# Patient Record
Sex: Female | Born: 2000 | Race: White | Hispanic: No | Marital: Single | Smoking: Never smoker
Health system: Southern US, Community
[De-identification: ages and names within clinical notes are randomized; demographics above are authoritative.]

## PROBLEM LIST (undated history)

## (undated) DIAGNOSIS — F419 Anxiety disorder, unspecified: Secondary | ICD-10-CM

## (undated) DIAGNOSIS — D649 Anemia, unspecified: Secondary | ICD-10-CM

## (undated) DIAGNOSIS — F988 Other specified behavioral and emotional disorders with onset usually occurring in childhood and adolescence: Secondary | ICD-10-CM

## (undated) DIAGNOSIS — A749 Chlamydial infection, unspecified: Secondary | ICD-10-CM

## (undated) DIAGNOSIS — F32A Depression, unspecified: Secondary | ICD-10-CM

## (undated) DIAGNOSIS — N390 Urinary tract infection, site not specified: Secondary | ICD-10-CM

## (undated) DIAGNOSIS — F429 Obsessive-compulsive disorder, unspecified: Secondary | ICD-10-CM

## (undated) DIAGNOSIS — F909 Attention-deficit hyperactivity disorder, unspecified type: Secondary | ICD-10-CM

## (undated) HISTORY — PX: MOUTH SURGERY: SHX715

---
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
## 2022-09-20 ENCOUNTER — Encounter (HOSPITAL_COMMUNITY): Payer: Self-pay | Admitting: Obstetrics and Gynecology

## 2022-09-20 ENCOUNTER — Inpatient Hospital Stay (HOSPITAL_COMMUNITY): Payer: BLUE CROSS/BLUE SHIELD

## 2022-09-20 ENCOUNTER — Inpatient Hospital Stay (HOSPITAL_COMMUNITY)
Admission: AD | Admit: 2022-09-20 | Discharge: 2022-09-20 | Disposition: A | Discharge status: Home / Self Care | Payer: BLUE CROSS/BLUE SHIELD | Attending: Obstetrics and Gynecology | Admitting: Obstetrics and Gynecology

## 2022-09-20 DIAGNOSIS — O26899 Other specified pregnancy related conditions, unspecified trimester: Secondary | ICD-10-CM

## 2022-09-20 DIAGNOSIS — Z3A01 Less than 8 weeks gestation of pregnancy: Secondary | ICD-10-CM

## 2022-09-20 DIAGNOSIS — R109 Unspecified abdominal pain: Secondary | ICD-10-CM | POA: Diagnosis present

## 2022-09-20 DIAGNOSIS — Z6791 Unspecified blood type, Rh negative: Secondary | ICD-10-CM

## 2022-09-20 DIAGNOSIS — O26891 Other specified pregnancy related conditions, first trimester: Secondary | ICD-10-CM | POA: Diagnosis not present

## 2022-09-20 HISTORY — DX: Depression, unspecified: F32.A

## 2022-09-20 HISTORY — DX: Urinary tract infection, site not specified: N39.0

## 2022-09-20 HISTORY — DX: Anemia, unspecified: D64.9

## 2022-09-20 HISTORY — DX: Anxiety disorder, unspecified: F41.9

## 2022-09-20 HISTORY — DX: Chlamydial infection, unspecified: A74.9

## 2022-09-20 LAB — COMPREHENSIVE METABOLIC PANEL
ALT: 14 U/L (ref 0–44)
AST: 16 U/L (ref 15–41)
Albumin: 4 g/dL (ref 3.5–5.0)
Alkaline Phosphatase: 45 U/L (ref 38–126)
Anion gap: 11 (ref 5–15)
BUN: 6 mg/dL (ref 6–20)
CO2: 24 mmol/L (ref 22–32)
Calcium: 9.5 mg/dL (ref 8.9–10.3)
Chloride: 103 mmol/L (ref 98–111)
Creatinine, Ser: 0.86 mg/dL (ref 0.44–1.00)
GFR, Estimated: >60 mL/min (ref >60)
Glucose, Bld: 88 mg/dL (ref 70–99)
Potassium: 4.3 mmol/L (ref 3.5–5.1)
Sodium: 138 mmol/L (ref 135–145)
Total Bilirubin: 0.4 mg/dL (ref 0.3–1.2)
Total Protein: 6.9 g/dL (ref 6.5–8.1)

## 2022-09-20 LAB — CBC WITH DIFFERENTIAL/PLATELET
Abs Immature Granulocytes: 0.02 10*3/uL (ref 0.00–0.07)
Basophils Absolute: 0 10*3/uL (ref 0.0–0.1)
Basophils Relative: 1 %
Eosinophils Absolute: 0.1 10*3/uL (ref 0.0–0.5)
Eosinophils Relative: 2 %
HCT: 34.6 % — ABNORMAL LOW (ref 36.0–46.0)
Hemoglobin: 11.8 g/dL — ABNORMAL LOW (ref 12.0–15.0)
Immature Granulocytes: 0 %
Lymphocytes Relative: 33 %
Lymphs Abs: 2 10*3/uL (ref 0.7–4.0)
MCH: 29.6 pg (ref 26.0–34.0)
MCHC: 34.1 g/dL (ref 30.0–36.0)
MCV: 86.9 fL (ref 80.0–100.0)
Monocytes Absolute: 0.5 10*3/uL (ref 0.1–1.0)
Monocytes Relative: 9 %
Neutro Abs: 3.2 10*3/uL (ref 1.7–7.7)
Neutrophils Relative %: 55 %
Platelets: 257 10*3/uL (ref 150–400)
RBC: 3.98 MIL/uL (ref 3.87–5.11)
RDW: 12.6 % (ref 11.5–15.5)
WBC: 5.9 10*3/uL (ref 4.0–10.5)
nRBC: 0 % (ref 0.0–0.2)

## 2022-09-20 LAB — WET PREP, GENITAL
Clue Cells Wet Prep HPF POC: NONE SEEN
Sperm: NONE SEEN
Trich, Wet Prep: NONE SEEN
WBC, Wet Prep HPF POC: <10 (ref <10)
Yeast Wet Prep HPF POC: NONE SEEN

## 2022-09-20 LAB — ABO/RH
ABO/RH(D): A NEG
Antibody Screen: NEGATIVE

## 2022-09-20 LAB — HCG, QUANTITATIVE, PREGNANCY: hCG, Beta Chain, Quant, S: 16571 m[IU]/mL — ABNORMAL HIGH (ref <5)

## 2022-09-20 NOTE — MAU Provider Note (Signed)
History     CSN: [id number]  Arrival date and time: 09/20/22 1530   None     Chief Complaint  Patient presents with   Abdominal Pain   HPI  Ms.Kathy Bauer is a 22 y.o. female G1P0 @ [redacted]w[redacted]d here in MAU for abdominal pain. She was seen at urgent care tonight and sent here for further evaluation. She wants to make sure everything is ok. She has no pain or bleeding at this time.   Patient is present today with her mom.   OB History     Gravida  1   Para      Term      Preterm      AB      Living         SAB      IAB      Ectopic      Multiple      Live Births              Past Medical History:  Diagnosis Date   ADHD    Anemia    Anxiety    Chlamydia    Depression    OCD (obsessive compulsive disorder)    UTI (urinary tract infection)     Past Surgical History:  Procedure Laterality Date   MOUTH SURGERY      Family History  Problem Relation Age of Onset   Fibromyalgia Mother    Healthy Father     Social History   Tobacco Use   Smoking status: Never   Smokeless tobacco: Never  Vaping Use   Vaping status: Former  Substance Use Topics   Alcohol use: Not Currently   Drug use: Not Currently    Types: Marijuana    Allergies:  Allergies  Allergen Reactions   Amoxicillin Hives    Medications Prior to Admission  Medication Sig Dispense Refill Last Dose   Prenatal Vit-Fe Fumarate-FA (MULTIVITAMIN-PRENATAL) 27-0.8 MG TABS tablet Take 1 tablet by mouth daily at 12 noon.   09/20/2022   amphetamine-dextroamphetamine (ADDERALL) 15 MG tablet Take 1 tablet by mouth daily after breakfast. 30 tablet 0    amphetamine-dextroamphetamine (ADDERALL) 15 MG tablet Take 1 tablet by mouth daily after breakfast. 30 tablet 0    amphetamine-dextroamphetamine (ADDERALL) 15 MG tablet Take 1 tablet by mouth daily after breakfast. 30 tablet 0    sertraline (ZOLOFT) 50 MG tablet TAKE 1 TABLET BY MOUTH EVERYDAY AT BEDTIME 90 tablet 1     Review of Systems   Genitourinary:  Negative for dysuria, frequency and urgency.   Physical Exam   Blood pressure (!) 109/56, pulse 74, temperature 98.4 F (36.9 C), temperature source Oral, resp. rate 17, height 5\' 5"  (1.651 m), weight 58.1 kg, last menstrual period 08/01/2022, SpO2 100%.  Physical Exam Constitutional:      General: She is not in acute distress.    Appearance: She is well-developed. She is not ill-appearing, toxic-appearing or diaphoretic.  Eyes:     Pupils: Pupils are equal, round, and reactive to light.  Neurological:     Mental Status: She is alert and oriented to person, place, and time.    MAU Course  Procedures  MDM  Wet prep & GC HIV, CBC, Hcg, ABO US OB transvaginal    Assessment and Plan   A:  1. Abdominal pain affecting pregnancy   2. [redacted] weeks gestation of pregnancy   3. Rh D negative blood type      P:  Dc home She has an appointment with [facility] Sept 5th.  She should keep that appointment Rhogam not given- no bleeding today Return to MAU if symptoms worsen Pelvic rest PV daily + folic acid 1 mg daily until 14 weeks.   Duane Lope, NP 09/20/2022 7:24 PM

## 2022-09-20 NOTE — MAU Note (Addendum)
Kathy Bauer is a 22 y.o. at [redacted]w[redacted]d here in MAU reporting: preg confirmed earlier today at urgent care.  Has been having some cramping- feels like period is going to start.  Went to UC, they confirmed the preg, but couldn't see anything on [place]. No bleeding. LMP: 7/5, regular cycles Onset of complaint: wk ago Pain score: moderate Vitals:   09/20/22 1607  BP: (!) 109/56  Pulse: 74  Resp: 17  Temp: 98.4 F (36.9 C)  SpO2: 100%      Lab orders placed from triage:  vag cultures  UA UPT done at [facility], able to see results.

## 2022-09-22 LAB — GC/CHLAMYDIA PROBE AMP (~~LOC~~) NOT AT ARMC
Chlamydia: NEGATIVE
Comment: NEGATIVE
Comment: NORMAL
Neisseria Gonorrhea: NEGATIVE

## 2022-10-02 DIAGNOSIS — N911 Secondary amenorrhea: Secondary | ICD-10-CM | POA: Diagnosis not present

## 2022-10-09 DIAGNOSIS — Z79899 Other long term (current) drug therapy: Secondary | ICD-10-CM | POA: Diagnosis not present

## 2022-10-09 DIAGNOSIS — Z880 Allergy status to penicillin: Secondary | ICD-10-CM | POA: Diagnosis not present

## 2022-10-09 DIAGNOSIS — O26891 Other specified pregnancy related conditions, first trimester: Secondary | ICD-10-CM | POA: Diagnosis not present

## 2022-10-09 DIAGNOSIS — O368911 Maternal care for other specified fetal problems, first trimester, fetus 1: Secondary | ICD-10-CM | POA: Diagnosis not present

## 2022-10-09 DIAGNOSIS — Z3A08 8 weeks gestation of pregnancy: Secondary | ICD-10-CM | POA: Diagnosis not present

## 2022-10-09 DIAGNOSIS — O2341 Unspecified infection of urinary tract in pregnancy, first trimester: Secondary | ICD-10-CM | POA: Diagnosis not present

## 2022-10-09 DIAGNOSIS — R1030 Lower abdominal pain, unspecified: Secondary | ICD-10-CM | POA: Diagnosis not present

## 2022-10-09 DIAGNOSIS — O208 Other hemorrhage in early pregnancy: Secondary | ICD-10-CM | POA: Diagnosis not present

## 2022-10-09 DIAGNOSIS — N390 Urinary tract infection, site not specified: Secondary | ICD-10-CM | POA: Diagnosis not present

## 2022-10-09 DIAGNOSIS — R197 Diarrhea, unspecified: Secondary | ICD-10-CM | POA: Diagnosis not present

## 2022-10-10 DIAGNOSIS — Z3685 Encounter for antenatal screening for Streptococcus B: Secondary | ICD-10-CM | POA: Diagnosis not present

## 2022-10-10 DIAGNOSIS — Z3A08 8 weeks gestation of pregnancy: Secondary | ICD-10-CM | POA: Diagnosis not present

## 2022-10-10 DIAGNOSIS — Z3401 Encounter for supervision of normal first pregnancy, first trimester: Secondary | ICD-10-CM | POA: Diagnosis not present

## 2022-10-10 DIAGNOSIS — Z3481 Encounter for supervision of other normal pregnancy, first trimester: Secondary | ICD-10-CM | POA: Diagnosis not present

## 2022-10-10 LAB — OB RESULTS CONSOLE RPR: RPR: NONREACTIVE

## 2022-10-10 LAB — OB RESULTS CONSOLE HEPATITIS B SURFACE ANTIGEN: Hepatitis B Surface Ag: NEGATIVE

## 2022-10-10 LAB — OB RESULTS CONSOLE HIV ANTIBODY (ROUTINE TESTING): HIV: NONREACTIVE

## 2022-10-10 LAB — HEPATITIS C ANTIBODY: HCV Ab: NEGATIVE

## 2022-10-10 LAB — OB RESULTS CONSOLE RUBELLA ANTIBODY, IGM: Rubella: IMMUNE

## 2022-10-21 ENCOUNTER — Inpatient Hospital Stay (HOSPITAL_COMMUNITY): Payer: 59

## 2022-10-21 ENCOUNTER — Inpatient Hospital Stay (HOSPITAL_COMMUNITY)
Admission: AD | Admit: 2022-10-21 | Discharge: 2022-10-21 | Disposition: A | Discharge status: Home / Self Care | Payer: 59 | Attending: Obstetrics and Gynecology | Admitting: Obstetrics and Gynecology

## 2022-10-21 ENCOUNTER — Encounter (HOSPITAL_COMMUNITY): Payer: Self-pay | Admitting: Obstetrics and Gynecology

## 2022-10-21 DIAGNOSIS — O99351 Diseases of the nervous system complicating pregnancy, first trimester: Secondary | ICD-10-CM | POA: Diagnosis not present

## 2022-10-21 DIAGNOSIS — J324 Chronic pansinusitis: Secondary | ICD-10-CM | POA: Insufficient documentation

## 2022-10-21 DIAGNOSIS — R519 Headache, unspecified: Secondary | ICD-10-CM | POA: Diagnosis not present

## 2022-10-21 DIAGNOSIS — Z113 Encounter for screening for infections with a predominantly sexual mode of transmission: Secondary | ICD-10-CM | POA: Diagnosis not present

## 2022-10-21 DIAGNOSIS — O219 Vomiting of pregnancy, unspecified: Secondary | ICD-10-CM | POA: Diagnosis not present

## 2022-10-21 DIAGNOSIS — G43909 Migraine, unspecified, not intractable, without status migrainosus: Secondary | ICD-10-CM

## 2022-10-21 DIAGNOSIS — O99511 Diseases of the respiratory system complicating pregnancy, first trimester: Secondary | ICD-10-CM | POA: Diagnosis not present

## 2022-10-21 DIAGNOSIS — G43009 Migraine without aura, not intractable, without status migrainosus: Secondary | ICD-10-CM | POA: Diagnosis not present

## 2022-10-21 DIAGNOSIS — Z3400 Encounter for supervision of normal first pregnancy, unspecified trimester: Secondary | ICD-10-CM | POA: Diagnosis not present

## 2022-10-21 DIAGNOSIS — Z3481 Encounter for supervision of other normal pregnancy, first trimester: Secondary | ICD-10-CM | POA: Diagnosis not present

## 2022-10-21 DIAGNOSIS — Z3A11 11 weeks gestation of pregnancy: Secondary | ICD-10-CM | POA: Diagnosis not present

## 2022-10-21 DIAGNOSIS — Z3A09 9 weeks gestation of pregnancy: Secondary | ICD-10-CM | POA: Diagnosis not present

## 2022-10-21 LAB — CBC WITH DIFFERENTIAL/PLATELET
Abs Immature Granulocytes: 0.08 10*3/uL — ABNORMAL HIGH (ref 0.00–0.07)
Basophils Absolute: 0 10*3/uL (ref 0.0–0.1)
Basophils Relative: 0 %
Eosinophils Absolute: 0.1 10*3/uL (ref 0.0–0.5)
Eosinophils Relative: 1 %
HCT: 29 % — ABNORMAL LOW (ref 36.0–46.0)
Hemoglobin: 10.2 g/dL — ABNORMAL LOW (ref 12.0–15.0)
Immature Granulocytes: 1 %
Lymphocytes Relative: 12 %
Lymphs Abs: 1.6 10*3/uL (ref 0.7–4.0)
MCH: 30 pg (ref 26.0–34.0)
MCHC: 35.2 g/dL (ref 30.0–36.0)
MCV: 85.3 fL (ref 80.0–100.0)
Monocytes Absolute: 0.7 10*3/uL (ref 0.1–1.0)
Monocytes Relative: 5 %
Neutro Abs: 10.9 10*3/uL — ABNORMAL HIGH (ref 1.7–7.7)
Neutrophils Relative %: 81 %
Platelets: 214 10*3/uL (ref 150–400)
RBC: 3.4 MIL/uL — ABNORMAL LOW (ref 3.87–5.11)
RDW: 12 % (ref 11.5–15.5)
WBC: 13.4 10*3/uL — ABNORMAL HIGH (ref 4.0–10.5)
nRBC: 0 % (ref 0.0–0.2)

## 2022-10-21 LAB — COMPREHENSIVE METABOLIC PANEL
ALT: 11 U/L (ref 0–44)
AST: 14 U/L — ABNORMAL LOW (ref 15–41)
Albumin: 3.3 g/dL — ABNORMAL LOW (ref 3.5–5.0)
Alkaline Phosphatase: 42 U/L (ref 38–126)
Anion gap: 9 (ref 5–15)
BUN: 5 mg/dL — ABNORMAL LOW (ref 6–20)
CO2: 22 mmol/L (ref 22–32)
Calcium: 9 mg/dL (ref 8.9–10.3)
Chloride: 105 mmol/L (ref 98–111)
Creatinine, Ser: 0.55 mg/dL (ref 0.44–1.00)
GFR, Estimated: >60 mL/min (ref >60)
Glucose, Bld: 85 mg/dL (ref 70–99)
Potassium: 3.8 mmol/L (ref 3.5–5.1)
Sodium: 136 mmol/L (ref 135–145)
Total Bilirubin: 0.4 mg/dL (ref 0.3–1.2)
Total Protein: 6.3 g/dL — ABNORMAL LOW (ref 6.5–8.1)

## 2022-10-21 LAB — URINALYSIS, ROUTINE W REFLEX MICROSCOPIC
Bilirubin Urine: NEGATIVE
Glucose, UA: NEGATIVE mg/dL
Hgb urine dipstick: NEGATIVE
Ketones, ur: NEGATIVE mg/dL
Leukocytes,Ua: NEGATIVE
Nitrite: NEGATIVE
Protein, ur: NEGATIVE mg/dL
Specific Gravity, Urine: 1.013 (ref 1.005–1.030)
pH: 7 (ref 5.0–8.0)

## 2022-10-21 MED ORDER — DIPHENHYDRAMINE HCL 50 MG/ML IJ SOLN
12.5000 mg | Freq: Once | INTRAMUSCULAR | Status: AC
  Administered 2022-10-21: 12.5 mg via INTRAVENOUS
  Filled 2022-10-21: qty 1

## 2022-10-21 MED ORDER — MAGNESIUM SULFATE 2 GM/50ML IV SOLN
2.0000 g | Freq: Once | INTRAVENOUS | Status: DC
  Filled 2022-10-21: qty 50

## 2022-10-21 MED ORDER — OXYCODONE-ACETAMINOPHEN 5-325 MG PO TABS: 1.0000 | ORAL_TABLET | ORAL | 0 refills | Status: DC | PRN

## 2022-10-21 MED ORDER — LACTATED RINGERS IV BOLUS: 1000.0000 mL | Freq: Once | INTRAVENOUS | Status: DC

## 2022-10-21 MED ORDER — LACTATED RINGERS IV BOLUS
1000.0000 mL | Freq: Once | INTRAVENOUS | Status: AC
  Administered 2022-10-21: 1000 mL via INTRAVENOUS

## 2022-10-21 MED ORDER — PROCHLORPERAZINE EDISYLATE 10 MG/2ML IJ SOLN
10.0000 mg | Freq: Once | INTRAMUSCULAR | Status: AC
  Administered 2022-10-21: 10 mg via INTRAVENOUS
  Filled 2022-10-21: qty 2

## 2022-10-21 MED ORDER — KETOROLAC TROMETHAMINE 30 MG/ML IJ SOLN
30.0000 mg | Freq: Once | INTRAMUSCULAR | Status: AC
  Administered 2022-10-21: 30 mg via INTRAVENOUS
  Filled 2022-10-21: qty 1

## 2022-10-21 MED ORDER — MUSCLE RUB 10-15 % EX CREA
TOPICAL_CREAM | CUTANEOUS | Status: DC | PRN
  Filled 2022-10-21: qty 85

## 2022-10-21 MED ORDER — IOHEXOL 350 MG/ML SOLN
75.0000 mL | Freq: Once | INTRAVENOUS | Status: AC | PRN
  Administered 2022-10-21: 75 mL via INTRAVENOUS

## 2022-10-21 MED ORDER — METOCLOPRAMIDE HCL 10 MG PO TABS: 10.0000 mg | ORAL_TABLET | Freq: Three times a day (TID) | ORAL | 1 refills | Status: DC

## 2022-10-21 NOTE — MAU Note (Signed)
Kathy Bauer is a 22 y.o. at [redacted]w[redacted]d here in MAU reporting: has a really bad migraine, started on Saturday, gets worse everyday. Taking Tylenol, no relief.  Keeps throwing up now Onset of complaint: Saturday Pain score: 10 Vitals:   10/21/22 1415  BP: 112/71  Pulse: 96  Resp: 16  Temp: 98.4 F (36.9 C)  SpO2: 100%     FHT:166 Lab orders placed from triage:  urine

## 2022-10-21 NOTE — MAU Provider Note (Addendum)
History     CSN: [id number]  Arrival date and time: 10/21/22 1341   Event Date/Time   First Provider Initiated Contact with Patient 10/21/22 1458      Chief Complaint  Patient presents with   Headache   Emesis   HPI  Ms.Kathy Bauer Is a 22 y.o. female G1P0 @ [redacted]w[redacted]d here in MAU with complaints of new onset HA. She reports symptoms started on Saturday. She took tylenol 1 gram and that did not provide any relief.  The HA is located on the top of her head, she feels like someone is hitting her on the head. She has had no injuries recently. She reports photophobia and nausea/ vomiting. Reports the HA is debilitating.    No history of migraines or HA. Has had sinus issues recently.   OB History     Gravida  1   Para      Term      Preterm      AB      Living         SAB      IAB      Ectopic      Multiple      Live Births              Past Medical History:  Diagnosis Date   ADHD    Anemia    Anxiety    Chlamydia    Depression    OCD (obsessive compulsive disorder)    UTI (urinary tract infection)     Past Surgical History:  Procedure Laterality Date   MOUTH SURGERY      Family History  Problem Relation Age of Onset   Fibromyalgia Mother    Healthy Father     Social History   Tobacco Use   Smoking status: Never   Smokeless tobacco: Never  Vaping Use   Vaping status: Former  Substance Use Topics   Alcohol use: Not Currently   Drug use: Not Currently    Types: Marijuana    Allergies:  Allergies  Allergen Reactions   Amoxicillin Hives    Medications Prior to Admission  Medication Sig Dispense Refill Last Dose   acetaminophen (TYLENOL) 500 MG tablet Take 1,000 mg by mouth every 6 (six) hours as needed for headache or moderate pain.   10/20/2022   Prenatal Vit-Fe Fumarate-FA (MULTIVITAMIN-PRENATAL) 27-0.8 MG TABS tablet Take 1 tablet by mouth daily at 12 noon.      sertraline (ZOLOFT) 50 MG tablet TAKE 1 TABLET BY MOUTH EVERYDAY  AT BEDTIME (Patient not taking: Reported on 10/21/2022) 90 tablet 1 Not Taking   Results for orders placed or performed during the hospital encounter of 10/21/22 (from the past 48 hour(s))  Urinalysis, Routine w reflex microscopic -Urine, Clean Catch     Status: Abnormal   Collection Time: 10/21/22  4:24 PM  Result Value Ref Range   Color, Urine YELLOW YELLOW   APPearance HAZY (A) CLEAR   Specific Gravity, Urine 1.013 1.005 - 1.030   pH 7.0 5.0 - 8.0   Glucose, UA NEGATIVE NEGATIVE mg/dL   Hgb urine dipstick NEGATIVE NEGATIVE   Bilirubin Urine NEGATIVE NEGATIVE   Ketones, ur NEGATIVE NEGATIVE mg/dL   Protein, ur NEGATIVE NEGATIVE mg/dL   Nitrite NEGATIVE NEGATIVE   Leukocytes,Ua NEGATIVE NEGATIVE    Comment: Performed at [facility], [address] [place], [place] [address]  CBC with Differential/Platelet     Status: Abnormal   Collection Time:  10/21/22  5:02 PM  Result Value Ref Range   WBC 13.4 (H) 4.0 - 10.5 K/uL   RBC 3.40 (L) 3.87 - 5.11 MIL/uL   Hemoglobin 10.2 (L) 12.0 - 15.0 g/dL   HCT 52.8 (L) 41.3 - 24.4 %   MCV 85.3 80.0 - 100.0 fL   MCH 30.0 26.0 - 34.0 pg   MCHC 35.2 30.0 - 36.0 g/dL   RDW 01.0 27.2 - 53.6 %   Platelets 214 150 - 400 K/uL   nRBC 0.0 0.0 - 0.2 %   Neutrophils Relative % 81 %   Neutro Abs 10.9 (H) 1.7 - 7.7 K/uL   Lymphocytes Relative 12 %   Lymphs Abs 1.6 0.7 - 4.0 K/uL   Monocytes Relative 5 %   Monocytes Absolute 0.7 0.1 - 1.0 K/uL   Eosinophils Relative 1 %   Eosinophils Absolute 0.1 0.0 - 0.5 K/uL   Basophils Relative 0 %   Basophils Absolute 0.0 0.0 - 0.1 K/uL   Immature Granulocytes 1 %   Abs Immature Granulocytes 0.08 (H) 0.00 - 0.07 K/uL    Comment: Performed at [facility], [address], [place], [place] [address]  Comprehensive metabolic panel     Status: Abnormal   Collection Time: 10/21/22  5:04 PM  Result Value Ref Range   Sodium 136 135 - 145 mmol/L   Potassium 3.8 3.5 - 5.1 mmol/L   Chloride 105 98 - 111  mmol/L   CO2 22 22 - 32 mmol/L   Glucose, Bld 85 70 - 99 mg/dL    Comment: Glucose reference range applies only to samples taken after fasting for at least 8 hours.   BUN 5 (L) 6 - 20 mg/dL   Creatinine, Ser 4.74 0.44 - 1.00 mg/dL   Calcium 9.0 8.9 - 25.9 mg/dL   Total Protein 6.3 (L) 6.5 - 8.1 g/dL   Albumin 3.3 (L) 3.5 - 5.0 g/dL   AST 14 (L) 15 - 41 U/L   ALT 11 0 - 44 U/L   Alkaline Phosphatase 42 38 - 126 U/L   Total Bilirubin 0.4 0.3 - 1.2 mg/dL   GFR, Estimated >56 >38 mL/min    Comment: (NOTE) Calculated using the CKD-EPI Creatinine Equation (2021)    Anion gap 9 5 - 15    Comment: Performed at [facility], [address], [place], [place] [address]    MR BRAIN WO CONTRAST  Result Date: 10/21/2022 CLINICAL DATA:  Initial evaluation for acute headache. EXAM: MRI HEAD WITHOUT CONTRAST TECHNIQUE: Multiplanar, multiecho pulse sequences of the brain and surrounding structures were obtained without intravenous contrast. COMPARISON:  Prior CT from earlier the same day. FINDINGS: Brain: Cerebral volume within normal limits for age. No focal parenchymal signal abnormality. No abnormal foci of restricted diffusion to suggest acute or subacute ischemia. Gray-white matter differentiation well maintained. No encephalomalacia to suggest chronic cortical infarction or other insult. No foci of susceptibility artifact indicative of acute or chronic intracranial blood products. No mass lesion, midline shift or mass effect. Ventricles normal in size and morphology without hydrocephalus. No extra-axial fluid collection. Pituitary gland and suprasellar region within normal limits. Vascular: Major intracranial vascular flow voids are well maintained. Skull and upper cervical spine: Craniocervical junction within normal limits. Visualized upper cervical spine demonstrates no significant finding. Bone marrow signal intensity within normal limits. No scalp soft tissue abnormality. Sinuses/Orbits: Globes  and orbital soft tissues are within normal limits. Extensive pan sinusitis again noted. Mastoid  air cells are largely clear. Other: None. IMPRESSION: 1. Normal brain MRI. No acute intracranial abnormality. 2. Extensive pan sinusitis. Electronically Signed   By: Rise Mu M.D.   On: 10/21/2022 22:18   CT VENOGRAM HEAD  Result Date: 10/21/2022 CLINICAL DATA:  Initial evaluation for acute headache. EXAM: CT VENOGRAM HEAD TECHNIQUE: Venographic phase images of the brain were obtained following the administration of intravenous contrast. Multiplanar reformats and maximum intensity projections were generated. RADIATION DOSE REDUCTION: This exam was performed according to the departmental dose-optimization program which includes automated exposure control, adjustment of the mA and/or kV according to patient size and/or use of iterative reconstruction technique. CONTRAST:  75mL OMNIPAQUE IOHEXOL 350 MG/ML SOLN COMPARISON:  None Available. FINDINGS: Brain: Cerebral volume within normal limits for patient age. No evidence for acute intracranial hemorrhage. No findings to suggest acute large vessel territory infarct. No mass lesion, midline shift, or mass effect. Ventricles are normal in size without evidence for hydrocephalus. No extra-axial fluid collection identified. Vascular: No hyperdense vessel seen prior to 5/administration. Following contrast administration, normal enhancement seen throughout the superior sagittal sinus to the torcula. Transverse and sigmoid sinuses are patent as are the jugular bulbs and visualized proximal internal jugular veins. Straight sinus, vein of Galen, and internal cerebral veins are patent. No appreciable cortical vein abnormality. No appreciable abnormality about the cavernous sinus. Skull: Scalp soft tissues demonstrate no acute abnormality. Calvarium intact. Sinuses/Orbits: Globes and orbital soft tissues within normal limits. Extensive pan sinusitis with near complete  opacification of the visualized paranasal sinuses. Mastoid air cells and middle ear cavities are clear. IMPRESSION: 1. Normal CT venogram. No evidence for dural sinus thrombosis. 2. No other acute intracranial abnormality. 3. Extensive pan sinusitis, which could contribute to headache. Electronically Signed   By: Rise Mu M.D.   On: 10/21/2022 19:45     Review of Systems  Constitutional:  Negative for fever.  Eyes:  Negative for photophobia and visual disturbance.  Gastrointestinal:  Positive for nausea and vomiting. Negative for diarrhea.  Neurological:  Positive for headaches.   Physical Exam   Blood pressure 112/71, pulse 96, temperature 98.4 F (36.9 C), temperature source Oral, resp. rate 16, height 5\' 5"  (1.651 m), weight 56.7 kg, last menstrual period 08/01/2022, SpO2 100%.  Physical Exam Constitutional:      General: She is not in acute distress.    Appearance: She is well-developed. She is ill-appearing. She is not toxic-appearing or diaphoretic.  Eyes:     Extraocular Movements: Extraocular movements intact.     Pupils: Pupils are equal, round, and reactive to light.  Pulmonary:     Effort: Pulmonary effort is normal.  Neurological:     General: No focal deficit present.     Mental Status: She is alert and oriented to person, place, and time.     GCS: GCS eye subscore is 4. GCS verbal subscore is 5. GCS motor subscore is 6.     Sensory: Sensation is intact.     Motor: Motor function is intact.  Psychiatric:        Behavior: Behavior normal.     MAU Course  Procedures None  MDM  LR bolus with Compazine and benadryl. Initially patient reports she felt better reporting 5/10 HA, after getting up her HA pain is back to 8/10 Toradol 30 mg IV and another bolus of fluid ordered CBC and CMP pending Awaiting Head CT. Pain now reported as 0/10 Reviewed HPI, labs and CT results with on-call Neurologist  Dr. Derry Lory who recommends IV magnesium bolus, and MRI of  brain. If normal, she can go home  Magnesium held due to 0/10 pain and patient ready to go home.  Report given to Kathy Bauer CNM who resumes care of the patient.   Kathy Bauer, CNM - MRI normal, plan for discharge.  Assessment and Plan   1. Acute migraine   2. [redacted] weeks gestation of pregnancy   3. Nausea and vomiting during pregnancy    - Reviewed worsening signs and return precautions.  - Recommendation for patient to see ENT per Neuro request.  - Patient agreeable to plan of care.  - Rx sent to outpatient pharmacy for pick upon.  - patient discharged home in stable condition and may return to MAU as needed   Kathy Bauer) Kathy Portela, MSN, CNM  [facility] [employer] [facility]  10/21/2022 11:01 PM

## 2022-11-10 ENCOUNTER — Telehealth: Payer: Self-pay

## 2022-11-14 ENCOUNTER — Telehealth: Payer: Self-pay

## 2022-11-14 NOTE — Telephone Encounter (Signed)
Left message for patient to call office to schedule GC for [facility]

## 2022-12-24 DIAGNOSIS — Z3402 Encounter for supervision of normal first pregnancy, second trimester: Secondary | ICD-10-CM | POA: Diagnosis not present

## 2022-12-24 DIAGNOSIS — Z361 Encounter for antenatal screening for raised alphafetoprotein level: Secondary | ICD-10-CM | POA: Diagnosis not present

## 2022-12-24 DIAGNOSIS — Z363 Encounter for antenatal screening for malformations: Secondary | ICD-10-CM | POA: Diagnosis not present

## 2022-12-24 DIAGNOSIS — Z3A18 18 weeks gestation of pregnancy: Secondary | ICD-10-CM | POA: Diagnosis not present

## 2023-01-26 DIAGNOSIS — Z3A23 23 weeks gestation of pregnancy: Secondary | ICD-10-CM | POA: Diagnosis not present

## 2023-01-26 DIAGNOSIS — Z362 Encounter for other antenatal screening follow-up: Secondary | ICD-10-CM | POA: Diagnosis not present

## 2023-01-28 NOTE — L&D Delivery Note (Signed)
 Delivery Note  SVD viable female Apgars over 8,9 over 2nd degree MLE.  Placenta delivered spontaneously intact with 3VC. Repair with 2-0 with good support and hemostasis noted.  R/V exam confirms. .   Mother and baby to couplet care and are doing well.  EBL 99 cc  Belle Box, MD

## 2023-02-25 DIAGNOSIS — Z23 Encounter for immunization: Secondary | ICD-10-CM | POA: Diagnosis not present

## 2023-02-25 DIAGNOSIS — Z3402 Encounter for supervision of normal first pregnancy, second trimester: Secondary | ICD-10-CM | POA: Diagnosis not present

## 2023-02-25 DIAGNOSIS — Z3481 Encounter for supervision of other normal pregnancy, first trimester: Secondary | ICD-10-CM | POA: Diagnosis not present

## 2023-02-25 DIAGNOSIS — Z3A27 27 weeks gestation of pregnancy: Secondary | ICD-10-CM | POA: Diagnosis not present

## 2023-03-12 DIAGNOSIS — Z3403 Encounter for supervision of normal first pregnancy, third trimester: Secondary | ICD-10-CM | POA: Diagnosis not present

## 2023-03-12 DIAGNOSIS — G43909 Migraine, unspecified, not intractable, without status migrainosus: Secondary | ICD-10-CM | POA: Diagnosis not present

## 2023-03-12 DIAGNOSIS — Z3A30 30 weeks gestation of pregnancy: Secondary | ICD-10-CM | POA: Diagnosis not present

## 2023-03-12 DIAGNOSIS — O360130 Maternal care for anti-D [Rh] antibodies, third trimester, not applicable or unspecified: Secondary | ICD-10-CM | POA: Diagnosis not present

## 2023-04-22 DIAGNOSIS — Z3A35 35 weeks gestation of pregnancy: Secondary | ICD-10-CM | POA: Diagnosis not present

## 2023-04-22 DIAGNOSIS — O26899 Other specified pregnancy related conditions, unspecified trimester: Secondary | ICD-10-CM | POA: Diagnosis not present

## 2023-04-22 DIAGNOSIS — R030 Elevated blood-pressure reading, without diagnosis of hypertension: Secondary | ICD-10-CM | POA: Diagnosis not present

## 2023-04-22 DIAGNOSIS — R3911 Hesitancy of micturition: Secondary | ICD-10-CM | POA: Diagnosis not present

## 2023-04-22 DIAGNOSIS — Z3403 Encounter for supervision of normal first pregnancy, third trimester: Secondary | ICD-10-CM | POA: Diagnosis not present

## 2023-04-23 ENCOUNTER — Inpatient Hospital Stay (HOSPITAL_COMMUNITY)
Admission: AD | Admit: 2023-04-23 | Discharge: 2023-04-23 | Disposition: A | Discharge status: Home / Self Care | Attending: Obstetrics and Gynecology | Admitting: Obstetrics and Gynecology

## 2023-04-23 ENCOUNTER — Encounter (HOSPITAL_COMMUNITY): Payer: Self-pay | Admitting: Obstetrics and Gynecology

## 2023-04-23 DIAGNOSIS — O479 False labor, unspecified: Secondary | ICD-10-CM | POA: Diagnosis not present

## 2023-04-23 DIAGNOSIS — Z3A37 37 weeks gestation of pregnancy: Secondary | ICD-10-CM | POA: Diagnosis not present

## 2023-04-23 DIAGNOSIS — O4703 False labor before 37 completed weeks of gestation, third trimester: Secondary | ICD-10-CM | POA: Insufficient documentation

## 2023-04-23 DIAGNOSIS — R109 Unspecified abdominal pain: Secondary | ICD-10-CM | POA: Insufficient documentation

## 2023-04-23 DIAGNOSIS — R030 Elevated blood-pressure reading, without diagnosis of hypertension: Secondary | ICD-10-CM | POA: Insufficient documentation

## 2023-04-23 DIAGNOSIS — O26893 Other specified pregnancy related conditions, third trimester: Secondary | ICD-10-CM | POA: Insufficient documentation

## 2023-04-23 LAB — URINALYSIS, ROUTINE W REFLEX MICROSCOPIC
Bilirubin Urine: NEGATIVE
Glucose, UA: NEGATIVE mg/dL
Hgb urine dipstick: NEGATIVE
Ketones, ur: NEGATIVE mg/dL
Leukocytes,Ua: NEGATIVE
Nitrite: NEGATIVE
Protein, ur: NEGATIVE mg/dL
Specific Gravity, Urine: 1.018 (ref 1.005–1.030)
pH: 6 (ref 5.0–8.0)

## 2023-04-23 MED ORDER — ONDANSETRON 4 MG PO TBDP
4.0000 mg | ORAL_TABLET | Freq: Once | ORAL | Status: AC
  Administered 2023-04-23: 4 mg via ORAL
  Filled 2023-04-23: qty 1

## 2023-04-23 NOTE — MAU Note (Signed)
..  Kathy Bauer is a 23 y.o. at [redacted]w[redacted]d here in MAU reporting: contractions for 2 hours that are now every 5 minutes Denies vaginal bleeding or lekaing of fluid Reports elevated blood pressure in the office and had lab in the office. Denies headache, epigastrc pain. Reports some visual changes occasionally, last occurrence was a few days ago.,    Pain score: 5/10 Vitals:   04/23/23 0218  BP: 128/75  Pulse: (!) 111  Resp: 16  Temp: 97.8 F (36.6 C)  SpO2: 100%     FHT:145 Lab orders placed from triage:  UA

## 2023-04-23 NOTE — MAU Provider Note (Signed)
 Chief Complaint:  Contractions and Hypertension   Event Date/Time   First Provider Initiated Contact with Patient 04/23/23 0240     HPI: Kathy Bauer is a 23 y.o. G1P0 at 62w6dwho presents to maternity admissions reporting uterine contractions.  States blood pressure was elevated in office today and they drew labs Has a followup appointment on Friday (tomorrow). She reports good fetal movement, denies LOF, vaginal bleeding, vaginal itching/burning, urinary symptoms, h/a, dizziness, n/v, or fever/chills.  She denies headache, visual changes or RUQ abdominal pain.  Abdominal Pain This is a new problem. The current episode started today. The problem occurs intermittently. The problem is unchanged. The pain is mild. The quality of the pain is described as cramping. The pain does not radiate. Pertinent negatives include no constipation, diarrhea or dysuria. Nothing relieves the symptoms. Past treatments include nothing.   RN Note: Kathy Bauer KitchenMattingly Bauer is a 23 y.o. at [redacted]w[redacted]d here in MAU reporting: contractions for 2 hours that are now every 5 minutes Denies vaginal bleeding or lekaing of fluid Reports elevated blood pressure in the office and had lab in the office. Denies headache, epigastrc pain. Reports some visual changes occasionally, last occurrence was a few days ago.,     Past Medical History: Past Medical History:  Diagnosis Date   ADHD    Anemia    Anxiety    Chlamydia    Depression    OCD (obsessive compulsive disorder)    UTI (urinary tract infection)     Past obstetric history: OB History  Gravida Para Term Preterm AB Living  1       SAB IAB Ectopic Multiple Live Births          # Outcome Date GA Lbr Len/2nd Weight Sex Type Anes PTL Lv  1 Current             Past Surgical History: Past Surgical History:  Procedure Laterality Date   MOUTH SURGERY      Family History: Family History  Problem Relation Age of Onset   Fibromyalgia Mother    Healthy Father     Social  History: Social History   Tobacco Use   Smoking status: Never   Smokeless tobacco: Never  Vaping Use   Vaping status: Former  Substance Use Topics   Alcohol use: Not Currently   Drug use: Not Currently    Types: Marijuana    Allergies:  Allergies  Allergen Reactions   Amoxicillin Hives    Meds:  Medications Prior to Admission  Medication Sig Dispense Refill Last Dose/Taking   Prenatal Vit-Fe Fumarate-FA (MULTIVITAMIN-PRENATAL) 27-0.8 MG TABS tablet Take 1 tablet by mouth daily at 12 noon.   04/22/2023   acetaminophen (TYLENOL) 500 MG tablet Take 1,000 mg by mouth every 6 (six) hours as needed for headache or moderate pain.      metoCLOPramide (REGLAN) 10 MG tablet Take 1 tablet (10 mg total) by mouth 4 (four) times daily -  before meals and at bedtime. 90 tablet 1    oxyCODONE-acetaminophen (PERCOCET) 5-325 MG tablet Take 1-2 tablets by mouth every 4 (four) hours as needed for severe pain. 5 tablet 0    sertraline (ZOLOFT) 50 MG tablet TAKE 1 TABLET BY MOUTH EVERYDAY AT BEDTIME (Patient not taking: Reported on 10/21/2022) 90 tablet 1     I have reviewed patient's Past Medical Hx, Surgical Hx, Family Hx, Social Hx, medications and allergies.   ROS:  Review of Systems  Gastrointestinal:  Positive for abdominal pain. Negative for  constipation and diarrhea.  Genitourinary:  Negative for dysuria.   Other systems negative  Physical Exam  Patient Vitals for the past 24 hrs:  BP Temp Temp src Pulse Resp SpO2 Height Weight  04/23/23 0228 128/75 97.8 F (36.6 C) Oral (!) 113 -- 100 % -- --  04/23/23 0218 128/75 97.8 F (36.6 C) Oral (!) 111 16 100 % 5\' 6"  (1.676 m) 74.8 kg   Vitals:   04/23/23 0218 04/23/23 0228 04/23/23 0245 04/23/23 0300  BP: 128/75 128/75 111/78 116/71   04/23/23 0328  BP: 115/72    Constitutional: Well-developed, well-nourished female in no acute distress.  Cardiovascular: normal rate Respiratory: normal effort GI: Abd soft, non-tender, gravid  appropriate for gestational age.   No rebound or guarding. MS: Extremities nontender, no edema, normal ROM Neurologic: Alert and oriented x 4.  GU: Neg CVAT.  PELVIC EXAM:  Dilation: Closed Cervical Position: Posterior, Middle Station: Ballotable Presentation: Undeterminable Exam by:: A Sherlon Handing RN  FHT:  Baseline 140 , moderate variability, accelerations present, no decelerations Contractions: Irregular     Labs: No results found for this or any previous visit (from the past 24 hours). --/--/A NEG (08/24 1640)  Imaging:  No results found.  MAU Course/MDM: I have reviewed the triage vital signs and the nursing notes.   Pertinent labs & imaging results that were available during my care of the patient were reviewed by me and considered in my medical decision making (see chart for details).      I have reviewed her medical records including past results, notes and treatments.   NST reviewed Blood pressures have all been normal here so labs not repeated.  Treatments in MAU included EFM.    Assessment: Single IUP at [redacted]w[redacted]d Irregular uterine contractions Normotensive  Plan: Discharge home Labor precautions and fetal kick counts Follow up in Office for prenatal visits and recheck Encouraged to return if she develops worsening of symptoms, increase in pain, fever, or other concerning symptoms.   Pt stable at time of discharge.  Wynelle Bourgeois CNM, MSN Certified Nurse-Midwife 04/23/2023 2:40 AM

## 2023-04-24 DIAGNOSIS — Z3A36 36 weeks gestation of pregnancy: Secondary | ICD-10-CM | POA: Diagnosis not present

## 2023-04-24 DIAGNOSIS — Z3685 Encounter for antenatal screening for Streptococcus B: Secondary | ICD-10-CM | POA: Diagnosis not present

## 2023-04-24 DIAGNOSIS — Z113 Encounter for screening for infections with a predominantly sexual mode of transmission: Secondary | ICD-10-CM | POA: Diagnosis not present

## 2023-04-24 LAB — OB RESULTS CONSOLE GBS: GBS: NEGATIVE

## 2023-05-05 ENCOUNTER — Encounter (HOSPITAL_COMMUNITY): Payer: Self-pay | Admitting: Obstetrics and Gynecology

## 2023-05-05 ENCOUNTER — Inpatient Hospital Stay (HOSPITAL_COMMUNITY)
Admission: AD | Admit: 2023-05-05 | Discharge: 2023-05-05 | Disposition: A | Discharge status: Home / Self Care | Payer: Self-pay | Attending: Obstetrics and Gynecology | Admitting: Obstetrics and Gynecology

## 2023-05-05 DIAGNOSIS — Z3A37 37 weeks gestation of pregnancy: Secondary | ICD-10-CM | POA: Diagnosis not present

## 2023-05-05 DIAGNOSIS — Z3493 Encounter for supervision of normal pregnancy, unspecified, third trimester: Secondary | ICD-10-CM | POA: Diagnosis not present

## 2023-05-05 LAB — URINALYSIS, ROUTINE W REFLEX MICROSCOPIC
Bilirubin Urine: NEGATIVE
Glucose, UA: NEGATIVE mg/dL
Hgb urine dipstick: NEGATIVE
Ketones, ur: NEGATIVE mg/dL
Leukocytes,Ua: NEGATIVE
Nitrite: NEGATIVE
Protein, ur: NEGATIVE mg/dL
Specific Gravity, Urine: 1.011 (ref 1.005–1.030)
pH: 6 (ref 5.0–8.0)

## 2023-05-05 LAB — POCT FERN TEST: POCT Fern Test: NEGATIVE

## 2023-05-05 LAB — WET PREP, GENITAL
Clue Cells Wet Prep HPF POC: NONE SEEN
Sperm: NONE SEEN
Trich, Wet Prep: NONE SEEN
WBC, Wet Prep HPF POC: >=10 — AB (ref <10)
Yeast Wet Prep HPF POC: NONE SEEN

## 2023-05-05 NOTE — MAU Note (Signed)
.  Kathy Bauer is a 23 y.o. at [redacted]w[redacted]d here in MAU reporting: CTX and LOF since 1630. She reports she has felt two gushes of fluid since then. Denies recent IC or cervical exams. Denies vaginal itching and vaginal odors. Denies VB. +FM.  GBS-.  Onset of complaint: 1630 Pain score: 2/10 lower abdomen  Vitals:   05/05/23 1723  BP: 124/68  Pulse: 79  Resp: 16  Temp: 98.3 F (36.8 C)  SpO2: 100%     FHT: 143 initial external Lab orders placed from triage: MAU Labor Eval

## 2023-05-05 NOTE — MAU Provider Note (Signed)
 Chief Complaint:  Contractions and Rupture of Membranes   HPI   Event Date/Time   First Provider Initiated Contact with Patient 05/05/23 1849      Kathy Bauer is a 23 y.o. G1P0 at [redacted]w[redacted]d who presents to maternity admissions reporting she had 2 episodes of LOF at work around 1630. Denies any recent IC, cervical exams and offers no OB c/o. Denies VB, CTX and reports good FM's.   Pregnancy Course: [facility]  Past Medical History:  Diagnosis Date   ADHD    Anemia    Anxiety    Chlamydia    Depression    OCD (obsessive compulsive disorder)    UTI (urinary tract infection)    OB History  Gravida Para Term Preterm AB Living  1       SAB IAB Ectopic Multiple Live Births          # Outcome Date GA Lbr Len/2nd Weight Sex Type Anes PTL Lv  1 Current            Past Surgical History:  Procedure Laterality Date   MOUTH SURGERY     Family History  Problem Relation Age of Onset   Fibromyalgia Mother    Healthy Father    Social History   Tobacco Use   Smoking status: Never   Smokeless tobacco: Never  Vaping Use   Vaping status: Former  Substance Use Topics   Alcohol use: Not Currently   Drug use: Not Currently    Types: Marijuana   Allergies  Allergen Reactions   Amoxicillin Hives   No medications prior to admission.    I have reviewed patient's Past Medical Hx, Surgical Hx, Family Hx, Social Hx, medications and allergies.   ROS  Pertinent items noted in HPI and remainder of comprehensive ROS otherwise negative.   PHYSICAL EXAM  Patient Vitals for the past 24 hrs:  BP Temp Temp src Pulse Resp SpO2  05/05/23 1901 (!) 119/59 -- -- 82 -- --  05/05/23 1723 124/68 98.3 F (36.8 C) Oral 79 16 100 %    Constitutional: Well-developed, well-nourished female in no acute distress.  Cardiovascular: normal rate & rhythm, warm and well-perfused Respiratory: normal effort, no problems with respiration noted GI: Abd soft, non-tender, gravid MS: Extremities  nontender, no edema, normal ROM Neurologic: Alert and oriented x 4.  GU: no CVA tenderness Pelvic: No pooling, no increased vaginal discharge noted, cervix visually closed SVE: Deferred     Fetal Tracing: Cat 1 Reactive @ 1900 Baseline: 135 Variability:moderate  Accelerations: present Decelerations:absent  Toco: occasional  ctx   Labs: Results for orders placed or performed during the hospital encounter of 05/05/23 (from the past 24 hours)  POCT fern test     Status: Normal   Collection Time: 05/05/23  6:49 PM  Result Value Ref Range   POCT Fern Test Negative = intact amniotic membranes   Wet prep, genital     Status: Abnormal   Collection Time: 05/05/23  7:01 PM  Result Value Ref Range   Yeast Wet Prep HPF POC NONE SEEN NONE SEEN   Trich, Wet Prep NONE SEEN NONE SEEN   Clue Cells Wet Prep HPF POC NONE SEEN NONE SEEN   WBC, Wet Prep HPF POC >=10 (A) <10   Sperm NONE SEEN   Urinalysis, Routine w reflex microscopic -Urine, Random     Status: None   Collection Time: 05/05/23  7:01 PM  Result Value Ref Range   Color, Urine  YELLOW YELLOW   APPearance CLEAR CLEAR   Specific Gravity, Urine 1.011 1.005 - 1.030   pH 6.0 5.0 - 8.0   Glucose, UA NEGATIVE NEGATIVE mg/dL   Hgb urine dipstick NEGATIVE NEGATIVE   Bilirubin Urine NEGATIVE NEGATIVE   Ketones, ur NEGATIVE NEGATIVE mg/dL   Protein, ur NEGATIVE NEGATIVE mg/dL   Nitrite NEGATIVE NEGATIVE   Leukocytes,Ua NEGATIVE NEGATIVE    Imaging:  No results found.  MDM & MAU COURSE  MDM:  HIGH  MAU Course: Orders Placed This Encounter  Procedures   Wet prep, genital   Urinalysis, Routine w reflex microscopic -Urine, Random   Contraction - monitoring   External fetal heart monitoring   Vaginal exam   POCT fern test   Fern Test   Discharge patient Discharge disposition: 01-Home or Self Care; Discharge patient date: 05/05/2023    I have reviewed the patient chart and performed the physical exam . I have ordered &  interpreted the lab results and reviewed and interpreted the NST Medications ordered as stated below.  A/P as described below.  Counseling and education provided and patient agreeable  with plan as described below. Verbalized understanding.    ASSESSMENT   1. Intact amniotic membranes during pregnancy in third trimester   2. [redacted] weeks gestation of pregnancy     PLAN  Discharge home in stable condition with return precautions.   See AVS for full description of information given to the patient including both verbal and written. Patient verbalized understanding and agrees with the plan as described above.     Allergies as of 05/05/2023       Reactions   Amoxicillin Hives        Medication List     STOP taking these medications    oxyCODONE-acetaminophen 5-325 MG tablet Commonly known as: Percocet       TAKE these medications    acetaminophen 500 MG tablet Commonly known as: TYLENOL Take 1,000 mg by mouth every 6 (six) hours as needed for headache or moderate pain.   metoCLOPramide 10 MG tablet Commonly known as: REGLAN Take 1 tablet (10 mg total) by mouth 4 (four) times daily -  before meals and at bedtime.   multivitamin-prenatal 27-0.8 MG Tabs tablet Take 1 tablet by mouth daily at 12 noon.   sertraline 50 MG tablet Commonly known as: ZOLOFT TAKE 1 TABLET BY MOUTH EVERYDAY AT BEDTIME       Marcell Barlow, MSN, [facility], [facility] [employer] [facility]

## 2023-05-06 LAB — GC/CHLAMYDIA PROBE AMP (~~LOC~~) NOT AT ARMC
Chlamydia: NEGATIVE
Comment: NEGATIVE
Comment: NORMAL
Neisseria Gonorrhea: NEGATIVE

## 2023-05-09 ENCOUNTER — Inpatient Hospital Stay (EMERGENCY_DEPARTMENT_HOSPITAL)
Admission: AD | Admit: 2023-05-09 | Discharge: 2023-05-10 | Disposition: A | Discharge status: Home / Self Care | Payer: Self-pay | Source: Home / Self Care | Attending: Obstetrics & Gynecology | Admitting: Obstetrics & Gynecology

## 2023-05-09 ENCOUNTER — Other Ambulatory Visit: Payer: Self-pay

## 2023-05-09 ENCOUNTER — Encounter (HOSPITAL_COMMUNITY): Payer: Self-pay | Admitting: Obstetrics & Gynecology

## 2023-05-09 DIAGNOSIS — O479 False labor, unspecified: Secondary | ICD-10-CM

## 2023-05-09 DIAGNOSIS — Z3A38 38 weeks gestation of pregnancy: Secondary | ICD-10-CM

## 2023-05-09 DIAGNOSIS — O471 False labor at or after 37 completed weeks of gestation: Secondary | ICD-10-CM | POA: Insufficient documentation

## 2023-05-09 NOTE — MAU Note (Signed)
 Kathy Bauer is a 23 y.o. at [redacted]w[redacted]d here in MAU reporting: she reports she's been contracting q68min for 2 hours. Endorses +FM, denies VB and LOF.    Onset of complaint: 2 hours ago Pain score: 6/10 ctx Vitals:   05/09/23 2352  BP: 120/72  Pulse: 82  Resp: 17  Temp: 98.2 F (36.8 C)  SpO2: 100%     FHT: 134 initial external  Lab orders placed from triage: mau labor

## 2023-05-10 ENCOUNTER — Encounter (HOSPITAL_COMMUNITY): Payer: Self-pay | Admitting: Obstetrics & Gynecology

## 2023-05-10 ENCOUNTER — Other Ambulatory Visit: Payer: Self-pay

## 2023-05-10 ENCOUNTER — Inpatient Hospital Stay (HOSPITAL_COMMUNITY)
Admission: AD | Admit: 2023-05-10 | Discharge: 2023-05-10 | Disposition: A | Discharge status: Home / Self Care | Source: Home / Self Care | Attending: Obstetrics & Gynecology | Admitting: Obstetrics & Gynecology

## 2023-05-10 DIAGNOSIS — O479 False labor, unspecified: Secondary | ICD-10-CM

## 2023-05-10 DIAGNOSIS — O471 False labor at or after 37 completed weeks of gestation: Secondary | ICD-10-CM | POA: Diagnosis not present

## 2023-05-10 DIAGNOSIS — Z3A38 38 weeks gestation of pregnancy: Secondary | ICD-10-CM

## 2023-05-10 DIAGNOSIS — F952 Tourette's disorder: Secondary | ICD-10-CM

## 2023-05-10 MED ORDER — PROMETHAZINE HCL 25 MG/ML IJ SOLN
25.0000 mg | Freq: Once | INTRAMUSCULAR | Status: AC
  Administered 2023-05-10: 25 mg via INTRAMUSCULAR
  Filled 2023-05-10: qty 1

## 2023-05-10 MED ORDER — MORPHINE SULFATE (PF) 4 MG/ML IV SOLN
4.0000 mg | Freq: Once | INTRAVENOUS | Status: AC
  Administered 2023-05-10: 4 mg via INTRAMUSCULAR
  Filled 2023-05-10: qty 1

## 2023-05-10 NOTE — MAU Provider Note (Signed)
 S: Ms. Tris Howell is a 23 y.o. G1P0 at [redacted]w[redacted]d  who presents to MAU today complaining contractions q 5 minutes since last night. She was seen in MAU at 0100 for labor evaluation.  She denies vaginal bleeding. She denies LOF. She reports normal fetal movement.    O: BP 124/88 (BP Location: Left Arm)   Pulse (!) 109   Temp 98.1 F (36.7 C) (Oral)   Resp 20   Ht 5\' 6"  (1.676 m)   Wt 74.6 kg   LMP 08/01/2022   SpO2 100%   BMI 26.55 kg/m  GENERAL: Well-developed, well-nourished female in no acute distress.  HEAD: Normocephalic, atraumatic.  CHEST: Normal effort of breathing, regular heart rate ABDOMEN: Soft, nontender, gravid  Cervical exam:  Dilation: 2.5 Effacement (%): 60 Cervical Position: Posterior Station: -2 Presentation: Vertex Exam by:: Ronie Cohen RN   Fetal Monitoring: Baseline: 135 bpm Variability: Moderate  Accelerations: 15x15 Decelerations: None Contractions: Q5-6 mins  MDM  Patient and patient's mom frustrated. Patient requesting pain medication. She has not been able to rest or sleep due to contraction pain.  IM phenergan and IM morphine given Instructed patient to go home and rest, take a warm bath, miles circuit.    A: SIUP at [redacted]w[redacted]d  False labor/ prodromal labor Support given  P: Dc home Labor precautions Return if symptoms worsen   Verdell Kincannon, Juliette Oh, NP 05/10/2023 7:58 PM

## 2023-05-10 NOTE — MAU Provider Note (Signed)
 Labor Check     S: Kathy Bauer is a 23 y.o. G1P0 at [redacted]w[redacted]d  who presents to MAU today complaining contractions q 4-5 minutes since 2200 4/12. She denies vaginal bleeding. She denies LOF. She reports normal fetal movement.    O: BP 100/66   Pulse 96   Temp 98.2 F (36.8 C) (Oral)   Resp 17   Ht 5\' 6"  (1.676 m)   Wt 74.8 kg   LMP 08/01/2022   SpO2 100%   BMI 26.63 kg/m  GENERAL: Well-developed, well-nourished female in no acute distress.  HEAD: Normocephalic, atraumatic.  CHEST: Normal effort of breathing, regular heart rate ABDOMEN: Soft, nontender, gravid  Cervical exam:  Dilation: (P) 3 Effacement (%): (P) 70 Station: (P) -2 Presentation: (P) Vertex Exam by:: Zenia Hight, RN   Fetal Monitoring: Baseline: 130 Variability: moderate Accelerations: + Decelerations: n/a Contractions: regular q55mins  Pt w/o changed cervical exam though states ctx more painful.  Offered continued observation, pt declines.  Offered pain medication, pt declines.  Stable for d/c.    A: SIUP at [redacted]w[redacted]d  False labor  P: - D/c with usual strict return precautions, Labor precautions - resume care for ongoing prenatal care with OP OBGYN   Ebony Goldstein, MD 05/10/2023 1:52 AM

## 2023-05-10 NOTE — MAU Note (Signed)
 Kathy Bauer is a 23 y.o. at [redacted]w[redacted]d here in MAU reporting: painful contractions that are about every five minutes that have increased in strength over the last 1.5 hours. Patient was seen here in MAU overnight for r/o labor. Patient denies having LOF or vaginal bleeding. Patient does report feeling fetal movement.  EDD:  05/21/2023 Onset of complaint: last night Pain score: 8 Vitals:   05/10/23 1635  BP: 119/82  Pulse: 99  Resp: 18  Temp: 98.1 F (36.7 C)  SpO2: 100%     FHT: 135

## 2023-05-11 ENCOUNTER — Inpatient Hospital Stay (HOSPITAL_COMMUNITY): Admitting: Anesthesiology

## 2023-05-11 ENCOUNTER — Encounter (HOSPITAL_COMMUNITY): Payer: Self-pay | Admitting: Obstetrics & Gynecology

## 2023-05-11 ENCOUNTER — Inpatient Hospital Stay (HOSPITAL_COMMUNITY)
Admission: AD | Admit: 2023-05-11 | Discharge: 2023-05-13 | DRG: 806 | Disposition: A | Discharge status: Home / Self Care | Attending: Obstetrics and Gynecology | Admitting: Obstetrics and Gynecology

## 2023-05-11 ENCOUNTER — Inpatient Hospital Stay (EMERGENCY_DEPARTMENT_HOSPITAL)
Admission: AD | Admit: 2023-05-11 | Discharge: 2023-05-11 | Disposition: A | Discharge status: Home / Self Care | Payer: Self-pay | Source: Home / Self Care | Attending: Obstetrics & Gynecology | Admitting: Obstetrics & Gynecology

## 2023-05-11 ENCOUNTER — Other Ambulatory Visit: Payer: Self-pay

## 2023-05-11 ENCOUNTER — Encounter (HOSPITAL_COMMUNITY): Payer: Self-pay | Admitting: Obstetrics and Gynecology

## 2023-05-11 DIAGNOSIS — O9962 Diseases of the digestive system complicating childbirth: Principal | ICD-10-CM | POA: Diagnosis present

## 2023-05-11 DIAGNOSIS — Z3A38 38 weeks gestation of pregnancy: Secondary | ICD-10-CM

## 2023-05-11 DIAGNOSIS — K219 Gastro-esophageal reflux disease without esophagitis: Secondary | ICD-10-CM | POA: Diagnosis present

## 2023-05-11 DIAGNOSIS — D62 Acute posthemorrhagic anemia: Secondary | ICD-10-CM | POA: Diagnosis not present

## 2023-05-11 DIAGNOSIS — O9081 Anemia of the puerperium: Secondary | ICD-10-CM | POA: Diagnosis not present

## 2023-05-11 DIAGNOSIS — O479 False labor, unspecified: Secondary | ICD-10-CM

## 2023-05-11 DIAGNOSIS — O26893 Other specified pregnancy related conditions, third trimester: Secondary | ICD-10-CM | POA: Diagnosis present

## 2023-05-11 DIAGNOSIS — Z23 Encounter for immunization: Secondary | ICD-10-CM | POA: Diagnosis not present

## 2023-05-11 DIAGNOSIS — O471 False labor at or after 37 completed weeks of gestation: Secondary | ICD-10-CM

## 2023-05-11 LAB — CBC
HCT: 31.2 % — ABNORMAL LOW (ref 36.0–46.0)
Hemoglobin: 10.4 g/dL — ABNORMAL LOW (ref 12.0–15.0)
MCH: 27.8 pg (ref 26.0–34.0)
MCHC: 33.3 g/dL (ref 30.0–36.0)
MCV: 83.4 fL (ref 80.0–100.0)
Platelets: 275 10*3/uL (ref 150–400)
RBC: 3.74 MIL/uL — ABNORMAL LOW (ref 3.87–5.11)
RDW: 13.2 % (ref 11.5–15.5)
WBC: 13.8 10*3/uL — ABNORMAL HIGH (ref 4.0–10.5)
nRBC: 0 % (ref 0.0–0.2)

## 2023-05-11 LAB — TYPE AND SCREEN
ABO/RH(D): A NEG
Antibody Screen: POSITIVE

## 2023-05-11 LAB — RPR: RPR Ser Ql: NONREACTIVE

## 2023-05-11 LAB — POCT FERN TEST: POCT Fern Test: POSITIVE

## 2023-05-11 MED ORDER — OXYTOCIN-SODIUM CHLORIDE 30-0.9 UT/500ML-% IV SOLN
1.0000 m[IU]/min | INTRAVENOUS | Status: DC
  Administered 2023-05-11: 2 m[IU]/min via INTRAVENOUS

## 2023-05-11 MED ORDER — ZOLPIDEM TARTRATE 5 MG PO TABS: 5.0000 mg | ORAL_TABLET | Freq: Every evening | ORAL | Status: DC | PRN

## 2023-05-11 MED ORDER — OXYCODONE-ACETAMINOPHEN 5-325 MG PO TABS: 1.0000 | ORAL_TABLET | ORAL | Status: DC | PRN

## 2023-05-11 MED ORDER — CYCLOBENZAPRINE HCL 10 MG PO TABS: 10.0000 mg | ORAL_TABLET | Freq: Three times a day (TID) | ORAL | 0 refills | Status: DC | PRN

## 2023-05-11 MED ORDER — LACTATED RINGERS IV SOLN: 500.0000 mL | INTRAVENOUS | Status: DC | PRN

## 2023-05-11 MED ORDER — CYCLOBENZAPRINE HCL 5 MG PO TABS
10.0000 mg | ORAL_TABLET | Freq: Once | ORAL | Status: AC
  Administered 2023-05-11: 10 mg via ORAL
  Filled 2023-05-11: qty 2

## 2023-05-11 MED ORDER — OXYTOCIN BOLUS FROM INFUSION
333.0000 mL | Freq: Once | INTRAVENOUS | Status: AC
  Administered 2023-05-11: 333 mL via INTRAVENOUS

## 2023-05-11 MED ORDER — FENTANYL CITRATE (PF) 100 MCG/2ML IJ SOLN
INTRAMUSCULAR | Status: AC
  Administered 2023-05-11: 100 ug via INTRAVENOUS
  Filled 2023-05-11: qty 2

## 2023-05-11 MED ORDER — ONDANSETRON HCL 4 MG PO TABS: 4.0000 mg | ORAL_TABLET | ORAL | Status: DC | PRN

## 2023-05-11 MED ORDER — IBUPROFEN 600 MG PO TABS
600.0000 mg | ORAL_TABLET | Freq: Four times a day (QID) | ORAL | Status: DC
  Administered 2023-05-12 – 2023-05-13 (×7): 600 mg via ORAL
  Filled 2023-05-11 (×7): qty 1

## 2023-05-11 MED ORDER — MEDROXYPROGESTERONE ACETATE 150 MG/ML IM SUSP: 150.0000 mg | INTRAMUSCULAR | Status: DC | PRN

## 2023-05-11 MED ORDER — OXYCODONE-ACETAMINOPHEN 5-325 MG PO TABS: 2.0000 | ORAL_TABLET | ORAL | Status: DC | PRN

## 2023-05-11 MED ORDER — LACTATED RINGERS IV SOLN: INTRAVENOUS | Status: DC

## 2023-05-11 MED ORDER — TERBUTALINE SULFATE 1 MG/ML IJ SOLN: 0.2500 mg | Freq: Once | INTRAMUSCULAR | Status: DC | PRN

## 2023-05-11 MED ORDER — ACETAMINOPHEN 325 MG PO TABS: 650.0000 mg | ORAL_TABLET | ORAL | Status: DC | PRN

## 2023-05-11 MED ORDER — SIMETHICONE 80 MG PO CHEW: 80.0000 mg | CHEWABLE_TABLET | ORAL | Status: DC | PRN

## 2023-05-11 MED ORDER — MORPHINE SULFATE (PF) 4 MG/ML IV SOLN
4.0000 mg | Freq: Once | INTRAVENOUS | Status: AC
  Administered 2023-05-11: 4 mg via INTRAMUSCULAR
  Filled 2023-05-11: qty 1

## 2023-05-11 MED ORDER — PHENYLEPHRINE 80 MCG/ML (10ML) SYRINGE FOR IV PUSH (FOR BLOOD PRESSURE SUPPORT): 80.0000 ug | PREFILLED_SYRINGE | INTRAVENOUS | Status: DC | PRN

## 2023-05-11 MED ORDER — DIPHENHYDRAMINE HCL 50 MG/ML IJ SOLN: 12.5000 mg | INTRAMUSCULAR | Status: DC | PRN

## 2023-05-11 MED ORDER — MEASLES, MUMPS & RUBELLA VAC IJ SOLR: 0.5000 mL | Freq: Once | INTRAMUSCULAR | Status: DC

## 2023-05-11 MED ORDER — BENZOCAINE-MENTHOL 20-0.5 % EX AERO
1.0000 | INHALATION_SPRAY | CUTANEOUS | Status: DC | PRN
  Administered 2023-05-12 – 2023-05-13 (×2): 1 via TOPICAL
  Filled 2023-05-11 (×2): qty 56

## 2023-05-11 MED ORDER — SENNOSIDES-DOCUSATE SODIUM 8.6-50 MG PO TABS
2.0000 | ORAL_TABLET | Freq: Every day | ORAL | Status: DC
  Administered 2023-05-12 – 2023-05-13 (×2): 2 via ORAL
  Filled 2023-05-11 (×2): qty 2

## 2023-05-11 MED ORDER — EPHEDRINE 5 MG/ML INJ: 10.0000 mg | INTRAVENOUS | Status: DC | PRN

## 2023-05-11 MED ORDER — FLEET ENEMA RE ENEM: 1.0000 | ENEMA | RECTAL | Status: DC | PRN

## 2023-05-11 MED ORDER — DIBUCAINE (PERIANAL) 1 % EX OINT: 1.0000 | TOPICAL_OINTMENT | CUTANEOUS | Status: DC | PRN

## 2023-05-11 MED ORDER — PROMETHAZINE HCL 25 MG/ML IJ SOLN
25.0000 mg | Freq: Once | INTRAMUSCULAR | Status: AC
  Administered 2023-05-11: 25 mg via INTRAMUSCULAR
  Filled 2023-05-11: qty 1

## 2023-05-11 MED ORDER — ONDANSETRON HCL 4 MG/2ML IJ SOLN
4.0000 mg | Freq: Four times a day (QID) | INTRAMUSCULAR | Status: DC | PRN
  Administered 2023-05-11: 4 mg via INTRAVENOUS
  Filled 2023-05-11: qty 2

## 2023-05-11 MED ORDER — WITCH HAZEL-GLYCERIN EX PADS: 1.0000 | MEDICATED_PAD | CUTANEOUS | Status: DC | PRN

## 2023-05-11 MED ORDER — ONDANSETRON HCL 4 MG/2ML IJ SOLN: 4.0000 mg | INTRAMUSCULAR | Status: DC | PRN

## 2023-05-11 MED ORDER — LIDOCAINE-EPINEPHRINE (PF) 2 %-1:200000 IJ SOLN
INTRAMUSCULAR | Status: DC | PRN
  Administered 2023-05-11: 5 mL via EPIDURAL

## 2023-05-11 MED ORDER — LACTATED RINGERS IV SOLN
500.0000 mL | Freq: Once | INTRAVENOUS | Status: AC
  Administered 2023-05-11: 500 mL via INTRAVENOUS

## 2023-05-11 MED ORDER — BUPIVACAINE HCL (PF) 0.25 % IJ SOLN
INTRAMUSCULAR | Status: DC | PRN
  Administered 2023-05-11 (×2): 5 mL via EPIDURAL

## 2023-05-11 MED ORDER — ACETAMINOPHEN 500 MG PO TABS
1000.0000 mg | ORAL_TABLET | Freq: Once | ORAL | Status: AC
  Administered 2023-05-11: 1000 mg via ORAL
  Filled 2023-05-11: qty 2

## 2023-05-11 MED ORDER — LIDOCAINE HCL (PF) 1 % IJ SOLN: 30.0000 mL | INTRAMUSCULAR | Status: DC | PRN

## 2023-05-11 MED ORDER — PRENATAL MULTIVITAMIN CH
1.0000 | ORAL_TABLET | Freq: Every day | ORAL | Status: DC
  Administered 2023-05-12 – 2023-05-13 (×2): 1 via ORAL
  Filled 2023-05-11 (×2): qty 1

## 2023-05-11 MED ORDER — COCONUT OIL OIL: 1.0000 | TOPICAL_OIL | Status: DC | PRN

## 2023-05-11 MED ORDER — FENTANYL CITRATE (PF) 100 MCG/2ML IJ SOLN: 50.0000 ug | INTRAMUSCULAR | Status: DC | PRN

## 2023-05-11 MED ORDER — DIPHENHYDRAMINE HCL 25 MG PO CAPS: 25.0000 mg | ORAL_CAPSULE | Freq: Four times a day (QID) | ORAL | Status: DC | PRN

## 2023-05-11 MED ORDER — FENTANYL-BUPIVACAINE-NACL 0.5-0.125-0.9 MG/250ML-% EP SOLN
12.0000 mL/h | EPIDURAL | Status: DC | PRN
  Administered 2023-05-11: 12 mL/h via EPIDURAL
  Filled 2023-05-11: qty 250

## 2023-05-11 MED ORDER — TETANUS-DIPHTH-ACELL PERTUSSIS 5-2.5-18.5 LF-MCG/0.5 IM SUSY: 0.5000 mL | PREFILLED_SYRINGE | Freq: Once | INTRAMUSCULAR | Status: DC

## 2023-05-11 MED ORDER — OXYTOCIN-SODIUM CHLORIDE 30-0.9 UT/500ML-% IV SOLN
2.5000 [IU]/h | INTRAVENOUS | Status: DC
  Administered 2023-05-11: 2.5 [IU]/h via INTRAVENOUS
  Filled 2023-05-11: qty 500

## 2023-05-11 MED ORDER — SOD CITRATE-CITRIC ACID 500-334 MG/5ML PO SOLN: 30.0000 mL | ORAL | Status: DC | PRN

## 2023-05-11 NOTE — MAU Provider Note (Signed)
 S Ms. Kathy Bauer is a 23 y.o. G1P0 patient who presents to MAU today with complaint of ctx that are becoming worse. She was seen yesterday afternoon ~1733 and was found to be 2.5 cm dilated and was given Morphine and Phenergan for pain management. Patient presents this AM c/o increasing painful ctx's requesting pain management. Denies any VB, LOF and reports good Fm's    Review of Systems  All other systems reviewed and are negative.  Unless otherwise noted in HPI  O BP (!) 98/49 (BP Location: Right Arm)   Pulse 77   Temp 98.3 F (36.8 C) (Oral)   Resp 17   LMP 08/01/2022   SpO2 100%  Physical Exam Vitals and nursing note reviewed.  Constitutional:      General: She is not in acute distress.    Appearance: Normal appearance. She is not ill-appearing.  HENT:     Head: Normocephalic.     Nose: Nose normal.     Mouth/Throat:     Mouth: Mucous membranes are moist.  Cardiovascular:     Rate and Rhythm: Normal rate and regular rhythm.  Pulmonary:     Effort: Pulmonary effort is normal.     Breath sounds: Normal breath sounds.  Abdominal:     Palpations: Abdomen is soft.  Musculoskeletal:        General: Normal range of motion.     Cervical back: Normal range of motion.  Skin:    General: Skin is warm.  Neurological:     Mental Status: She is alert and oriented to person, place, and time.  Psychiatric:        Mood and Affect: Mood normal.        Behavior: Behavior normal. Behavior is cooperative.        Thought Content: Thought content normal.        Judgment: Judgment normal.     Exam by RN. @ 0257 3.5/90-2 by Tasia Farr   FHR:  Cat 1, reactive @ 0322 Baseline: 140 Variability: moderate  Accelerations: present Decelerations: absent Toco: irregular ctx  Exam by RN @ [phone] 4/90/-2 Intact   Discussed with OB Attending Dr Cipriano Creeks ( Likely prodromal labor - Will continue to observe and recheck in 1 hour) - Requesting more pain medication -  Tylenol/Flexeril ordered   NST: Cat 1, reactive @ 0516 Toco: Ctx have spaced out irregular   Exam by RN at 0600 4/90/-2 Intact ( No Change)   Plan for discharge and OB F/u as scheduled  MDM  HIGH   I have reviewed the patient chart and performed the physical exam . I have ordered & interpreted the lab results and reviewed and interpreted the NST Medications ordered as stated below.  A/P as described below.  Counseling and education provided and patient agreeable  with plan as described below. Verbalized understanding.    ASSESSMENT Medical screening exam complete   False labor after 37 completed weeks of gestation - No evidence of active labor  Uterine contractions during pregnancy - Irregular ctx   [redacted] weeks gestation of pregnancy   PLAN  Meds ordered this encounter  Medications   morphine (PF) 4 MG/ML injection 4 mg   promethazine (PHENERGAN) injection 25 mg   cyclobenzaprine (FLEXERIL) 10 MG tablet    Sig: Take 1 tablet (10 mg total) by mouth 3 (three) times daily as needed for muscle spasms.    Dispense:  20 tablet    Refill:  0    Supervising  Provider:   PRATT, TANYA S [[id number]]   acetaminophen (TYLENOL) tablet 1,000 mg   cyclobenzaprine (FLEXERIL) tablet 10 mg     Discharge from MAU in stable condition  F/[facility] as scheduled with [facility]   See AVS for full description of educational information and instructions provided to the patient at time of discharge  Warning signs for worsening condition that would warrant emergency follow-up discussed Patient may return to MAU as needed   Cherlynn Cornfield, NP 05/11/2023 6:07 AM

## 2023-05-11 NOTE — Lactation Note (Signed)
 This note was copied from a baby's chart. Lactation Consultation Note  Patient Name: Kathy Bauer ZOXWR'[id number] Date: 05/11/2023 Age:21 hours Reason for consult: Initial assessment;Primapara;Early term 53-38.6wks Mom had baby on the breast when LC came into rm. Baby was BF swaddled in cradle position. Mom stated it was hurting some. LC adjusted body alignment. Discussed feeding positions, body alignment, support, STS, I&O, supply and demand. Mom encouraged to feed baby 8-12 times/24 hours and with feeding cues.  Mom is really tired. Mom stated once she finishes feeding she is going to rest. Mom is very sleepy. Encouraged mom to try to stay awake while BF.  Encouraged mom to call if she needs assistance and call for [facility] for next feeding that LC wanted to work w/mom on positioning, body alignment and support as well as latching. Mom stated OK.  Maternal Data Does the patient have breastfeeding experience prior to this delivery?: No  Feeding    LATCH Score Latch: Grasps breast easily, tongue down, lips flanged, rhythmical sucking.  Audible Swallowing: A few with stimulation  Type of Nipple: Flat (semi flat/very very short shaft)  Comfort (Breast/Nipple): Soft / non-tender  Hold (Positioning): No assistance needed to correctly position infant at breast.  LATCH Score: 8   Lactation Tools Discussed/Used    Interventions Interventions: Breast feeding basics reviewed;Adjust position;Position options;Education  Discharge    Consult Status Consult Status: Follow-up Date: 05/12/23 Follow-up type: In-patient    Nuh Lipton G 05/11/2023, 10:19 PM

## 2023-05-11 NOTE — MAU Note (Signed)
.  Kathy Bauer is a 23 y.o. at [redacted]w[redacted]d here in MAU reporting: Water broke 30 minago. Clear fluid. Was here last night with ctx was 4 cm. Ctx still coming.   LMP:  Onset of complaint: 30 min Pain score: 10 Vitals:   05/11/23 0838  BP: 108/60  Pulse: (!) 123  Resp: 18  Temp: 97.9 F (36.6 C)     FHT: 146  Lab orders placed from triage:

## 2023-05-11 NOTE — MAU Note (Addendum)
.  Kathy Bauer is a 23 y.o. at [redacted]w[redacted]d here in MAU reporting: was sent home yesterday after labor eval. Reports ctx have worsened since midnight - every 3-5 minutes. Denies VB or LOF. +FM. Reports N/V from the pain.   LMP: NA  Onset of complaint: 0000 Pain score: 8 Vitals:   05/11/23 0257  BP: 123/82  Pulse: (!) 112  Resp: 18  Temp: 98.3 F (36.8 C)  SpO2: 100%     FHT: 147  Lab orders placed from triage: labor eval

## 2023-05-11 NOTE — H&P (Signed)
 Kathy Bauer is a 23 y.o. female presenting for SROM and active labor.  Pregnancy uncomplicated.  GBS neg. OB History     Gravida  1   Para      Term      Preterm      AB      Living         SAB      IAB      Ectopic      Multiple      Live Births             Past Medical History:  Diagnosis Date   ADHD    Anemia    Anxiety    Chlamydia    Depression    OCD (obsessive compulsive disorder)    UTI (urinary tract infection)    Past Surgical History:  Procedure Laterality Date   MOUTH SURGERY     Family History: family history includes Fibromyalgia in her mother; Healthy in her father. Social History:  reports that she has never smoked. She has never used smokeless tobacco. She reports that she does not currently use alcohol. She reports that she does not currently use drugs after having used the following drugs: Marijuana.     Maternal Diabetes: No Genetic Screening: Normal Maternal Ultrasounds/Referrals: Normal Fetal Ultrasounds or other Referrals:  None Maternal Substance Abuse:  No Significant Maternal Medications:  None Significant Maternal Lab Results:  Group B Strep negative Number of Prenatal Visits:greater than 3 verified prenatal visits Maternal Vaccinations:TDap Other Comments:  None  Review of Systems History Dilation: 6 Effacement (%): 80, 90 Station: -2 Exam by:: Olivia P RN Blood pressure 108/60, pulse (!) 123, temperature 97.9 F (36.6 C), resp. rate 18, last menstrual period 08/01/2022. Exam Physical Exam  Vitals and nursing note reviewed. Exam conducted with a chaperone present.  Constitutional:      Appearance: Normal appearance.  HENT:     Head: Normocephalic.  Eyes:     Pupils: Pupils are equal, round, and reactive to light.  Cardiovascular:     Rate and Rhythm: Normal rate and regular rhythm.     Pulses: Normal pulses.  Abdominal:     General: Abdomen is Gravid, nontender Neurological:     Mental Status: She is  alert. Pt wishes Full resuscitation in the event of a code. Prenatal labs: ABO, Rh: --/--/PENDING (04/14 7829) Antibody: PENDING (04/14 0918) Rubella:   RPR:    HBsAg:    HIV:    GBS:     Assessment/Plan: IUP 38 weeks SROM and labor Getting CLEA now Anticipate SVD May need Pit prn    Kathy Bauer 05/11/2023, 10:13 AM

## 2023-05-11 NOTE — Anesthesia Procedure Notes (Signed)
 Epidural Patient location during procedure: OB Start time: 05/11/2023 10:14 AM End time: 05/11/2023 10:18 AM  Staffing Anesthesiologist: Leslye Rast, MD Performed: anesthesiologist   Preanesthetic Checklist Completed: patient identified, IV checked, site marked, risks and benefits discussed, surgical consent, monitors and equipment checked, pre-op evaluation and timeout performed  Epidural Patient position: sitting Prep: DuraPrep Patient monitoring: heart rate, continuous pulse ox and blood pressure Approach: midline Location: L4-L5 Injection technique: LOR saline  Needle:  Needle type: Tuohy  Needle gauge: 17 G Needle length: 9 cm Needle insertion depth: 5 cm Catheter type: closed end flexible Catheter size: 19 Gauge Catheter at skin depth: 10 cm Test dose: negative and 1.5% lidocaine with Epi 1:200 K  Assessment Events: blood not aspirated, no cerebrospinal fluid, injection not painful, no injection resistance, no paresthesia and negative IV test  Additional Notes Reason for block:procedure for pain

## 2023-05-11 NOTE — Anesthesia Preprocedure Evaluation (Signed)
 Anesthesia Evaluation  Patient identified by MRN, date of birth, ID band Patient awake    Reviewed: Allergy & Precautions, NPO status , Patient's Chart, lab work & pertinent test results, reviewed documented beta blocker date and time   History of Anesthesia Complications Negative for: history of anesthetic complications  Airway Mallampati: II  TM Distance: >3 FB     Dental no notable dental hx.    Pulmonary neg COPD   breath sounds clear to auscultation       Cardiovascular negative cardio ROS  Rhythm:Regular Rate:Normal     Neuro/Psych neg Seizures PSYCHIATRIC DISORDERS Anxiety Depression       GI/Hepatic Neg liver ROS,GERD  ,,  Endo/Other    Renal/GU negative Renal ROS     Musculoskeletal   Abdominal   Peds  Hematology  (+) Blood dyscrasia, anemia   Anesthesia Other Findings   Reproductive/Obstetrics (+) Pregnancy                             Anesthesia Physical Anesthesia Plan  ASA: 2  Anesthesia Plan: Epidural   Post-op Pain Management:    Induction:   PONV Risk Score and Plan: 2 and Ondansetron  Airway Management Planned:   Additional Equipment:   Intra-op Plan:   Post-operative Plan:   Informed Consent: I have reviewed the patients History and Physical, chart, labs and discussed the procedure including the risks, benefits and alternatives for the proposed anesthesia with the patient or authorized representative who has indicated his/her understanding and acceptance.       Plan Discussed with:   Anesthesia Plan Comments:        Anesthesia Quick Evaluation

## 2023-05-11 NOTE — Progress Notes (Signed)
 Transferred to Mother/Baby unit via wheelchair with infant in arms.

## 2023-05-11 NOTE — Lactation Note (Signed)
 This note was copied from a baby's chart. Lactation Consultation Note  Patient Name: Kathy Bauer Today's Date: 05/11/2023 Age:21 hours Reason for consult: L&D Initial assessment;Primapara;Early term 37-38.6wks Assisted baby to the breast in football position d/t baby wouldn't be able to get latching in cradle. Baby was off and on the breast frequently. LC had to keep breast compressed when latched in order to obtain deeper latch and baby feel in her mouth. Mom will need pump, shells and possible NS. Will f/u w/mom on MBU.  Maternal Data Does the patient have breastfeeding experience prior to this delivery?: No  Feeding    LATCH Score Latch: Grasps breast easily, tongue down, lips flanged, rhythmical sucking.  Audible Swallowing: None  Type of Nipple: Flat (semi flat/very very short shaft)  Comfort (Breast/Nipple): Soft / non-tender  Hold (Positioning): Full assist, staff holds infant at breast  LATCH Score: 5   Lactation Tools Discussed/Used    Interventions Interventions: Breast feeding basics reviewed;Assisted with latch;Skin to skin;Breast compression;Adjust position;Support pillows;Position options;Education  Discharge    Consult Status Consult Status: Follow-up from L&D Date: 05/12/23 Follow-up type: In-patient    [name] G 05/11/2023, 8:42 PM

## 2023-05-12 ENCOUNTER — Encounter (HOSPITAL_COMMUNITY): Payer: Self-pay | Admitting: Obstetrics and Gynecology

## 2023-05-12 LAB — CBC
HCT: 22.7 % — ABNORMAL LOW (ref 36.0–46.0)
Hemoglobin: 7.5 g/dL — ABNORMAL LOW (ref 12.0–15.0)
MCH: 27.4 pg (ref 26.0–34.0)
MCHC: 33 g/dL (ref 30.0–36.0)
MCV: 82.8 fL (ref 80.0–100.0)
Platelets: 188 10*3/uL (ref 150–400)
RBC: 2.74 MIL/uL — ABNORMAL LOW (ref 3.87–5.11)
RDW: 13.5 % (ref 11.5–15.5)
WBC: 15.5 10*3/uL — ABNORMAL HIGH (ref 4.0–10.5)
nRBC: 0 % (ref 0.0–0.2)

## 2023-05-12 MED ORDER — FERROUS SULFATE 325 (65 FE) MG PO TABS
325.0000 mg | ORAL_TABLET | Freq: Every day | ORAL | Status: DC
  Administered 2023-05-12 – 2023-05-13 (×2): 325 mg via ORAL
  Filled 2023-05-12 (×2): qty 1

## 2023-05-12 NOTE — Anesthesia Postprocedure Evaluation (Signed)
 Anesthesia Post Note  Patient: Kathy Bauer  Procedure(s) Performed: AN AD HOC LABOR EPIDURAL     Patient location during evaluation: Mother Baby Anesthesia Type: Epidural Level of consciousness: awake, oriented and awake and alert Pain management: pain level controlled Vital Signs Assessment: post-procedure vital signs reviewed and stable Respiratory status: spontaneous breathing, nonlabored ventilation and respiratory function stable Cardiovascular status: stable Postop Assessment: no headache, patient able to bend at knees, adequate PO intake, no apparent nausea or vomiting, able to ambulate and no backache Anesthetic complications: no   No notable events documented.  Last Vitals:  Vitals:   05/12/23 0330 05/12/23 0748  BP: (!) 94/52 (!) 98/54  Pulse: 93 83  Resp: 16   Temp: 37.1 C 36.5 C  SpO2: 99% 100%    Last Pain:  Vitals:   05/12/23 0748  TempSrc:   PainSc: 0-No pain   Pain Goal:                   Kathy Bauer

## 2023-05-12 NOTE — Social Work (Signed)
CSW acknowledged consult and completed a clinical assessment.  There are no barriers to d/c.  Clinical assessment notes will be entered at a later time.  Tymar Polyak, LCSWA Clinical Social Worker [phone]  

## 2023-05-12 NOTE — Progress Notes (Signed)
 Postpartum Progress Note  S: No complaints. Feeling well. Lochia appropriate. No subjective fevers/chills. No lightheadedness, dizziness, SOB.   O:     05/12/2023    7:48 AM 05/12/2023    3:30 AM 05/11/2023   10:51 PM  Vitals with BMI  Systolic 98 94 111  Diastolic 54 52 64  Pulse 83 93 107   Gen: NAD, A&O Pulm: NWOB Abd: soft, appropriately ttp, fundus firm and below Umb Ext: No evidence of DVT, trace edema b/l  Labs Recent Results (from the past 2160 hours)  Urinalysis, Routine w reflex microscopic -Urine, Clean Catch     Status: Abnormal   Collection Time: 04/23/23  2:38 AM  Result Value Ref Range   Color, Urine YELLOW YELLOW   APPearance HAZY (A) CLEAR   Specific Gravity, Urine 1.018 1.005 - 1.030   pH 6.0 5.0 - 8.0   Glucose, UA NEGATIVE NEGATIVE mg/dL   Hgb urine dipstick NEGATIVE NEGATIVE   Bilirubin Urine NEGATIVE NEGATIVE   Ketones, ur NEGATIVE NEGATIVE mg/dL   Protein, ur NEGATIVE NEGATIVE mg/dL   Nitrite NEGATIVE NEGATIVE   Leukocytes,Ua NEGATIVE NEGATIVE    Comment: Performed at [facility], [address] [place], [place] [address]  POCT fern test     Status: Normal   Collection Time: 05/05/23  6:49 PM  Result Value Ref Range   POCT Fern Test Negative = intact amniotic membranes   Wet prep, genital     Status: Abnormal   Collection Time: 05/05/23  7:01 PM  Result Value Ref Range   Yeast Wet Prep HPF POC NONE SEEN NONE SEEN   Trich, Wet Prep NONE SEEN NONE SEEN   Clue Cells Wet Prep HPF POC NONE SEEN NONE SEEN   WBC, Wet Prep HPF POC >=10 (A) <10   Sperm NONE SEEN     Comment: Performed at [facility], [address], [place], [place] [address]  GC/Chlamydia probe amp ([place])not at [facility]     Status: None   Collection Time: 05/05/23  7:01 PM  Result Value Ref Range   Neisseria Gonorrhea Negative    Chlamydia Negative    Comment Normal Reference Ranger Chlamydia - Negative    Comment      Normal Reference Range Neisseria Gonorrhea -  Negative  Urinalysis, Routine w reflex microscopic -Urine, Random     Status: None   Collection Time: 05/05/23  7:01 PM  Result Value Ref Range   Color, Urine YELLOW YELLOW   APPearance CLEAR CLEAR   Specific Gravity, Urine 1.011 1.005 - 1.030   pH 6.0 5.0 - 8.0   Glucose, UA NEGATIVE NEGATIVE mg/dL   Hgb urine dipstick NEGATIVE NEGATIVE   Bilirubin Urine NEGATIVE NEGATIVE   Ketones, ur NEGATIVE NEGATIVE mg/dL   Protein, ur NEGATIVE NEGATIVE mg/dL   Nitrite NEGATIVE NEGATIVE   Leukocytes,Ua NEGATIVE NEGATIVE    Comment: Performed at [facility], [address] [place], [place] [address]  POCT fern test     Status: Abnormal   Collection Time: 05/11/23  9:00 AM  Result Value Ref Range   POCT Fern Test Positive = ruptured amniotic membanes   Type and screen [facility]     Status: None   Collection Time: 05/11/23  9:18 AM  Result Value Ref Range   ABO/RH(D) A NEG    Antibody Screen POS    Sample Expiration 05/14/2023,2359    Antibody Identification  PASSIVELY ACQUIRED ANTI-D Performed at [facility], [address] [place], [place] [address]   CBC     Status: Abnormal   Collection Time: 05/11/23  9:19 AM  Result Value Ref Range   WBC 13.8 (H) 4.0 - 10.5 K/uL   RBC 3.74 (L) 3.87 - 5.11 MIL/uL   Hemoglobin 10.4 (L) 12.0 - 15.0 g/dL   HCT 57.8 (L) 46.9 - 62.9 %   MCV 83.4 80.0 - 100.0 fL   MCH 27.8 26.0 - 34.0 pg   MCHC 33.3 30.0 - 36.0 g/dL   RDW 52.8 41.3 - 24.4 %   Platelets 275 150 - 400 K/uL   nRBC 0.0 0.0 - 0.2 %    Comment: Performed at [facility], [address], [place], [place] [address]  RPR     Status: None   Collection Time: 05/11/23  9:19 AM  Result Value Ref Range   RPR Ser Ql NON REACTIVE NON REACTIVE    Comment: Performed at [facility], [address] [place], [place] [address]  CBC     Status: Abnormal   Collection Time: 05/12/23  5:34 AM  Result Value Ref Range   WBC 15.5 (H) 4.0 - 10.5 K/uL   RBC  2.74 (L) 3.87 - 5.11 MIL/uL   Hemoglobin 7.5 (L) 12.0 - 15.0 g/dL    Comment: REPEATED TO VERIFY   HCT 22.7 (L) 36.0 - 46.0 %   MCV 82.8 80.0 - 100.0 fL   MCH 27.4 26.0 - 34.0 pg   MCHC 33.0 30.0 - 36.0 g/dL   RDW 44.0 34.7 - 42.5 %   Platelets 188 150 - 400 K/uL   nRBC 0.0 0.0 - 0.2 %    Comment: Performed at [facility], [address], [place], [place] [address]   A/P:  PPD1 s/p SVD, doing well pp. AFVSS. Benign exam.  Acute blood loss anemia - no s/s. Hgb 7.5 this AM. Will start oral iron supplementation. Continue present care. Plan for d/c tomorrow.  Cathryn Cobb, MD

## 2023-05-12 NOTE — Lactation Note (Signed)
 This note was copied from a baby's chart. Lactation Consultation Note  Patient Name: Kathy Bauer ZOXWR'U Date: 05/12/2023 Age:21 hours, P1  Reason for consult: Follow-up assessment;Primapara;Initial assessment;1st time breastfeeding;Early term 37-38.6wks;Infant weight loss;Breastfeeding assistance (1 % weight loss) MOB called out for [facility] assistance.  Baby wide awake and rooting, LC changed a large wet and large loose stool.  LC offered to assist to latch and mom receptive.  LC noted areola edema, 1st tired with just hand expressing and reverse pressure. Latch 6 on and off. 2nd attempt, Prepumped and hand expressed 1st and reverse pressure and the baby latched for 3 mins with swallows and off and relatched 2nd time with wider mouth and stayed for 4 mins. LC noted 8 swallows and released.  Per mom was more comfortable. Latch score 8 .  LC reviewed 24 hours feeding goals of feed with cues and by 3 hours offer the breast STS with steps prior to latch before each feeding until the baby is consistent and use the football position.  LC sized mom for #18 F and per mom comfortable.    Maternal Data Has patient been taught Hand Expression?: Yes (good flow)  Feeding Mother's Current Feeding Choice: Breast Milk  LATCH Score Latch: Grasps breast easily, tongue down, lips flanged, rhythmical sucking.  Audible Swallowing: Spontaneous and intermittent  Type of Nipple: Everted at rest and after stimulation (nipple more erect after pre-pumping with the hand pump and reverse pressure.)  Comfort (Breast/Nipple): Soft / non-tender  Hold (Positioning): Full assist, staff holds infant at breast  LATCH Score: 8   Lactation Tools Discussed/Used Tools: Shells;Pump;Flanges Flange Size: 18 Breast pump type: Manual Pump Education: Setup, frequency, and cleaning;Milk Storage Reason for Pumping: LC recommended steps for latching - breast massage, hand express, pre-pump with the hand pump and reverse  pressure , football position  Interventions Interventions: Breast feeding basics reviewed;Assisted with latch;Skin to skin;Breast massage;Hand express;Pre-pump if needed;Reverse pressure;Breast compression;Adjust position;Support pillows;Position options;Shells;Hand pump;Education;LC Services brochure;CDC milk storage guidelines;CDC Guidelines for Breast Pump Cleaning  Discharge Pump: Hands Free;Manual;Personal  Consult Status Consult Status: Follow-up Date: 05/13/23 Follow-up type: In-patient    Renda Carpen Opal Mckellips 05/12/2023, 11:06 AM

## 2023-05-13 MED ORDER — RHO D IMMUNE GLOBULIN 1500 UNIT/2ML IJ SOSY
300.0000 ug | PREFILLED_SYRINGE | Freq: Once | INTRAMUSCULAR | Status: DC
  Filled 2023-05-13: qty 2

## 2023-05-13 MED ORDER — ACETAMINOPHEN 325 MG PO TABS: 650.0000 mg | ORAL_TABLET | Freq: Four times a day (QID) | ORAL | 0 refills | Status: AC | PRN

## 2023-05-13 MED ORDER — RHO D IMMUNE GLOBULIN 1500 UNIT/2ML IJ SOSY
300.0000 ug | PREFILLED_SYRINGE | Freq: Once | INTRAMUSCULAR | Status: AC
  Administered 2023-05-13: 300 ug via INTRAMUSCULAR
  Filled 2023-05-13: qty 2

## 2023-05-13 MED ORDER — IBUPROFEN 600 MG PO TABS: 600.0000 mg | ORAL_TABLET | Freq: Four times a day (QID) | ORAL | 0 refills | Status: AC | PRN

## 2023-05-13 NOTE — Discharge Summary (Signed)
 Postpartum Discharge Summary  Date of Service updated 05/13/23     Patient Name: Kathy Bauer DOB: 18-Jul-2000 MRN: [id number]  Date of admission: 05/11/2023 Delivery date:05/11/2023 Delivering provider: Candice Camp Date of discharge: 05/13/2023  Admitting diagnosis: Normal labor [O80, Z37.9] Intrauterine pregnancy: [redacted]w[redacted]d     Secondary diagnosis:  Principal Problem:   Normal labor  Additional problems:     Discharge diagnosis: Term Pregnancy Delivered                                              Post partum procedures: Augmentation:  Complications: None  Hospital course: Onset of Labor With Vaginal Delivery      23 y.o. yo G1P1001 at [redacted]w[redacted]d was admitted in Active Labor on 05/11/2023. Labor course was complicated by  Membrane Rupture Time/Date: 8:00 AM,05/11/2023  Delivery Method:Vaginal, Spontaneous Operative Delivery: Episiotomy: Median Lacerations:  None Patient had a postpartum course complicated by .  She is ambulating, tolerating a regular diet, passing flatus, and urinating well. Patient is discharged home in stable condition on 05/13/23.  Newborn Data: Birth date:05/11/2023 Birth time:7:14 PM Gender:Female Living status:Living Apgars:8 ,9  Weight:3160 g  Magnesium Sulfate received: No BMZ received: No Rhophylac:Yes MMR:No T-DaP:Given prenatally Flu:  RSV Vaccine received:  Transfusion:No Immunizations administered: There is no immunization history for the selected administration types on file for this patient.  Physical exam  Vitals:   05/12/23 0748 05/12/23 1410 05/12/23 2235 05/13/23 0552  BP: (!) 98/54 124/64 (!) 98/59 (!) 102/58  Pulse: 83 86 89 83  Resp:  17 20 20   Temp: 97.7 F (36.5 C) 97.7 F (36.5 C) 97.9 F (36.6 C) 97.9 F (36.6 C)  TempSrc:  Oral Oral Oral  SpO2: 100% 100% 100% 100%   General: alert, cooperative, and no distress Lochia: appropriate Uterine Fundus: firm Incision: Healing well with no significant drainage DVT  Evaluation: No evidence of DVT seen on physical exam. Labs: Lab Results  Component Value Date   WBC 15.5 (H) 05/12/2023   HGB 7.5 (L) 05/12/2023   HCT 22.7 (L) 05/12/2023   MCV 82.8 05/12/2023   PLT 188 05/12/2023      Latest Ref Rng & Units 10/21/2022    5:04 PM  CMP  Glucose 70 - 99 mg/dL 85   BUN 6 - 20 mg/dL 5   Creatinine 1.30 - 8.65 mg/dL 7.84   Sodium 696 - 295 mmol/L 136   Potassium 3.5 - 5.1 mmol/L 3.8   Chloride 98 - 111 mmol/L 105   CO2 22 - 32 mmol/L 22   Calcium 8.9 - 10.3 mg/dL 9.0   Total Protein 6.5 - 8.1 g/dL 6.3   Total Bilirubin 0.3 - 1.2 mg/dL 0.4   Alkaline Phos 38 - 126 U/L 42   AST 15 - 41 U/L 14   ALT 0 - 44 U/L 11    Edinburgh Score:    05/12/2023    7:24 PM  Edinburgh Postnatal Depression Scale Screening Tool  I have been able to laugh and see the funny side of things. 0  I have looked forward with enjoyment to things. 0  I have blamed myself unnecessarily when things went wrong. 1  I have been anxious or worried for no good reason. 2  I have felt scared or panicky for no good reason. 1  Things have been getting on  top of me. 1  I have been so unhappy that I have had difficulty sleeping. 0  I have felt sad or miserable. 0  I have been so unhappy that I have been crying. 0  The thought of harming myself has occurred to me. 0  Edinburgh Postnatal Depression Scale Total 5      After visit meds:  Allergies as of 05/13/2023       Reactions   Amoxicillin Hives        Medication List     STOP taking these medications    cyclobenzaprine 10 MG tablet Commonly known as: FLEXERIL   metoCLOPramide 10 MG tablet Commonly known as: REGLAN   sertraline 50 MG tablet Commonly known as: ZOLOFT       TAKE these medications    acetaminophen 325 MG tablet Commonly known as: Tylenol Take 2 tablets (650 mg total) by mouth every 6 (six) hours as needed (for pain scale < 4). What changed:  medication strength how much to take reasons to  take this   ferrous sulfate 325 (65 FE) MG tablet Take 325 mg by mouth daily with breakfast.   ibuprofen 600 MG tablet Commonly known as: ADVIL Take 1 tablet (600 mg total) by mouth every 6 (six) hours as needed.   multivitamin-prenatal 27-0.8 MG Tabs tablet Take 1 tablet by mouth daily at 12 noon.         Discharge home in stable condition Infant Feeding: Breast Infant Disposition:home with mother Discharge instruction: per After Visit Summary and Postpartum booklet. Activity: Advance as tolerated. Pelvic rest for 6 weeks.  Diet: routine diet Anticipated Birth Control: Unsure Postpartum Appointment:6 weeks Additional Postpartum F/U:  Future Appointments:No future appointments. Follow up Visit:      05/13/2023 Jeanmarie Millet, MD

## 2023-05-13 NOTE — Social Work (Signed)
 CSW received consult for hx of Anxiety and Depression.  CSW met with MOB to offer support and complete assessment. CSW entered the room and observed MOB in bed resting an several room guest. CSW introduced self, CSW role and requested to speak with MOB alone, room guest willingly stepped out. CSW inquired about how MOB was feeling, MOB reported good. CSW inquired about MOB MH hx, MOB reported she was diagnosed with anxiety and OCD in 75 and took zoloft from Kindergarten through her senior year of high school. MOB reported her depression is more situational. MOB reported she no longer takes medication and utilizes breathing exercises to mange her symptoms. CSW assessed for current safety, MOB denied any SI, HI or DV. CSW provided education regarding the baby blues period vs. perinatal mood disorders, discussed treatment and gave resources for mental health follow up if concerns arise.  CSW recommends self-evaluation during the postpartum time period using the New Mom Checklist from Postpartum Progress and encouraged MOB to contact a medical professional if symptoms are noted at any time.  MOB identified her mom and dad as her supports.   CSW provided review of Sudden Infant Death Syndrome (SIDS) precautions.  MOB identified [facility] for infants follow up care. MOB reported she has all necessary items for the infant including a crib and car seat CSW identifies no further need for intervention and no barriers to discharge at this time.  Camaryn Lumbert, LCSWA Clinical Social Worker [phone]

## 2023-05-13 NOTE — Lactation Note (Signed)
 This note was copied from a baby's chart. Lactation Consultation Note  Patient Name: Kathy Bauer YNWGN'F Date: 05/13/2023 Age:21 hours Reason for consult: Follow-up assessment;Primapara;1st time breastfeeding;Early term 37-38.6wks;Infant weight loss (5 % weight loss,) As LC entered the room baby was latched with depth on the right breast with depth. Swallows noted and per mom comfortable.  Per mom baby breast fed the left breast 13 mins and is still feeding on right breast while LC reviewed steps for latching.  Nipple well rounded when the baby released.  LC reviewed the steps for latching set up yesterday and stressed the use of shells between feedings while awake especially when the milk was coming in. LC reviewed engorgement prevention and tx.  Mom has shells, and a hand pump.  LC offered to request and LC O/P and mom declined.   Maternal Data Has patient been taught Hand Expression?: Yes  Feeding Mother's Current Feeding Choice: Breast Milk  LATCH Score Latch:  (latched with depth)  Audible Swallowing:  (swallows mnoted)  Type of Nipple:  (nipple well rounded when the baby released)  Comfort (Breast/Nipple):  (per mom breast are filling)  Hold (Positioning):  (mom had latched the baby)      Lactation Tools Discussed/Used Tools: Shells;Pump Flange Size: 18 Breast pump type: Manual Pump Education: Setup, frequency, and cleaning;Milk Storage  Interventions Interventions: Breast feeding basics reviewed;Shells;Hand pump;Education;LC Services brochure;CDC Guidelines for Breast Pump Cleaning;CDC milk storage guidelines  Discharge Discharge Education: Engorgement and breast care;Warning signs for feeding baby;Other (comment) (LC asked mom if she would like the LC to request and LC O.P and mom declined. Mom has the [facility] O/P phone number if needed) Pump: Personal;Hands Free;Manual  Consult Status Consult Status: Complete Date: 05/13/23    Richarda Chance 05/13/2023, 1:58 PM

## 2023-05-14 LAB — RH IG WORKUP (INCLUDES ABO/RH)
Fetal Screen: NEGATIVE
Gestational Age(Wks): 38
Unit division: 0

## 2023-05-19 ENCOUNTER — Telehealth (HOSPITAL_COMMUNITY): Payer: Self-pay

## 2023-05-19 NOTE — Telephone Encounter (Signed)
 05/19/2023 1810  Name: Kathy Bauer MRN: [id number] DOB: 11/18/2000  Reason for Call:  Transition of Care Hospital Discharge Call  Contact Status: Patient Contact Status: Message  Language assistant needed:          Follow-Up Questions:    Dimple Francis Postnatal Depression Scale:  In the Past 7 Days:    PHQ2-9 Depression Scale:     Discharge Follow-up:    Post-discharge interventions: NA  Signature  Wadell Guild

## 2023-06-30 DIAGNOSIS — R87612 Low grade squamous intraepithelial lesion on cytologic smear of cervix (LGSIL): Secondary | ICD-10-CM | POA: Diagnosis not present

## 2023-06-30 DIAGNOSIS — Z130 Encounter for screening for diseases of the blood and blood-forming organs and certain disorders involving the immune mechanism: Secondary | ICD-10-CM | POA: Diagnosis not present

## 2023-06-30 DIAGNOSIS — Z124 Encounter for screening for malignant neoplasm of cervix: Secondary | ICD-10-CM | POA: Diagnosis not present

## 2023-09-14 ENCOUNTER — Ambulatory Visit
Admission: EM | Admit: 2023-09-14 | Discharge: 2023-09-14 | Disposition: A | Discharge status: Home / Self Care | Attending: Family Medicine | Admitting: Family Medicine

## 2023-09-14 ENCOUNTER — Other Ambulatory Visit: Payer: Self-pay

## 2023-09-14 DIAGNOSIS — R509 Fever, unspecified: Secondary | ICD-10-CM

## 2023-09-14 DIAGNOSIS — B349 Viral infection, unspecified: Secondary | ICD-10-CM | POA: Diagnosis not present

## 2023-09-14 LAB — POC SOFIA SARS ANTIGEN FIA: SARS Coronavirus 2 Ag: NEGATIVE

## 2023-09-14 NOTE — ED Triage Notes (Signed)
 Pt c/o rash on legs bilat started this morning. Pt c/o dizziness, fever 100F, chills and diarrhea started this morning. Pt states her daughter's classroom has fifth's disease and daughter was around grandma that was exposed to people who tested positive for COVID. Pt has scattered bumps on upper legs bilat that are not raised.

## 2023-09-14 NOTE — Discharge Instructions (Signed)
 You have tested negative for COVID-19. Please treat your symptoms with over the counter cough medication, tylenol  or ibuprofen, humidifier, and rest. Viral illnesses can last 7-14 days. Please follow up with your PCP if your symptoms are not improving. Please go to the ER for any worsening symptoms. This includes but is not limited to fever you can not control with tylenol  or ibuprofen, you are not able to stay hydrated, you have shortness of breath or chest pain.  Thank you for choosing [facility] for your healthcare needs. I hope you feel better soon!

## 2023-09-14 NOTE — ED Provider Notes (Signed)
 [facility]    CSN: [id number] Arrival date & time: 09/14/23  1750      History   Chief Complaint No chief complaint on file.   HPI Kathy Bauer is a 23 y.o. female  presents for evaluation of URI symptoms for 1 days. Patient reports associated symptoms of fever 100 degrees, chills, nausea with diarrhea, improving rash on thighs. Denies vomiting, cough, congestion, sore throat, body aches, shortness of breath. Patient does not have a hx of asthma. Patient is not an active smoker.   Reports daughter has similar rash on thighs and she she was also exposed to COVID last week.  Eating and drinking normally.  Pt has taken nothing OTC for symptoms. Pt has no other concerns at this time.   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 Authorizing Provider  acetaminophen  (TYLENOL ) 325 MG tablet Take 2 tablets (650 mg total) by mouth every 6 (six) hours as needed (for pain scale < 4). 05/13/23   Curlene Agent, MD  ferrous sulfate  325 (65 FE) MG tablet Take 325 mg by mouth daily with breakfast.    [provider]  ibuprofen  (ADVIL ) 600 MG tablet Take 1 tablet (600 mg total) by mouth every 6 (six) hours  as needed. 05/13/23   Tomblin, James, MD  Prenatal Vit-Fe Fumarate-FA (MULTIVITAMIN-PRENATAL) 27-0.8 MG TABS tablet Take 1 tablet by mouth daily at 12 noon.    [provider]  sertraline  (ZOLOFT ) 50 MG tablet Take 50 mg by mouth daily.    [provider]    Family History Family History  Problem Relation Age of Onset   Fibromyalgia Mother    Healthy Father     Social History Social History   Tobacco Use   Smoking status: Never   Smokeless tobacco: Never  Vaping Use   Vaping status: Former  Substance Use Topics   Alcohol use: Yes    Comment: occ   Drug use: Not Currently    Types: Marijuana    Comment: socially smoked marijuana before pregnant     Allergies   Amoxicillin   Review of Systems Review of Systems  Constitutional:  Positive for fever.  Gastrointestinal:  Positive for diarrhea and nausea.  Skin:  Positive for rash.     Physical Exam Triage Vital Signs ED Triage Vitals  Encounter Vitals Group  BP 09/14/23 1822 108/70     Girls Systolic BP Percentile --      Girls Diastolic BP Percentile --      Boys Systolic BP Percentile --      Boys Diastolic BP Percentile --      Pulse Rate 09/14/23 1822 (!) 107     Resp 09/14/23 1822 16     Temp 09/14/23 1822 98.7 F (37.1 C)     Temp src --      SpO2 09/14/23 1822 93 %     Weight --      Height --      Head Circumference --      Peak Flow --      Pain Score 09/14/23 1820 0     Pain Loc --      Pain Education --      Exclude from Growth Chart --    No data found.  Updated Vital Signs BP 108/70   Pulse (!) 107   Temp 98.7 F (37.1 C)   Resp 16   LMP 08/24/2023   SpO2 93%   Breastfeeding No   Visual Acuity Right Eye Distance:   Left Eye Distance:   Bilateral Distance:    Right Eye Near:   Left Eye Near:    Bilateral Near:     Physical Exam Vitals and nursing note reviewed.  Constitutional:      General: She is not in acute distress.    Appearance: Normal  appearance. She is well-developed. She is not ill-appearing.  HENT:     Head: Normocephalic and atraumatic.     Right Ear: Tympanic membrane and ear canal normal.     Left Ear: Tympanic membrane and ear canal normal.     Nose: No congestion.     Mouth/Throat:     Mouth: Mucous membranes are moist.     Pharynx: Oropharynx is clear. Uvula midline. No posterior oropharyngeal erythema.     Tonsils: No tonsillar exudate or tonsillar abscesses.  Eyes:     Conjunctiva/sclera: Conjunctivae normal.     Pupils: Pupils are equal, round, and reactive to light.  Cardiovascular:     Rate and Rhythm: Normal rate and regular rhythm.     Heart sounds: Normal heart sounds.  Pulmonary:     Effort: Pulmonary effort is normal.     Breath sounds: Normal breath sounds. No wheezing or rhonchi.  Musculoskeletal:     Cervical back: Normal range of motion and neck supple.  Lymphadenopathy:     Cervical: No cervical adenopathy.  Skin:    General: Skin is warm and dry.         Comments: There is a very faint mildly erythematous macular rash on bilateral upper thighs.  It is not raised.  No sandpaper appearance.  No swelling drainage.  There is no rash located on torso arms face neck or lower legs.  Neurological:     General: No focal deficit present.     Mental Status: She is alert and oriented to person, place, and time.  Psychiatric:        Mood and Affect: Mood normal.        Behavior: Behavior normal.      UC Treatments / Results  Labs (all labs ordered are listed, but only abnormal results are displayed) Labs Reviewed  POC SOFIA SARS ANTIGEN FIA    EKG   Radiology No results found.  Procedures Procedures (including critical care time)  Medications Ordered  in UC Medications - No data to display  Initial Impression / Assessment and Plan / UC Course  I have reviewed the triage vital signs and the nursing notes.  Pertinent labs & imaging results that were available during my care of  the patient were reviewed by me and considered in my medical decision making (see chart for details).  Clinical Course as of 09/14/23 1926  Mon Sep 14, 2023  1905 HR recheck 92 [JM]    Clinical Course User Index [JM] Loreda Myla SAUNDERS, NP    Reviewed exam and symptoms with patient.  No red flags.  Negative rapid COVID testing.  Discussed viral illness and symptomatic treatment.  Advise rest fluids and PCP follow-up if symptoms do not improve.  ER precautions reviewed. Final Clinical Impressions(s) / UC Diagnoses   Final diagnoses:  Fever, unspecified  Viral illness     Discharge Instructions      You have tested negative for COVID-19.  Please treat your symptoms with over the counter cough medication, tylenol  or ibuprofen , humidifier, and rest. Viral illnesses can last 7-14 days. Please follow up with your PCP if your symptoms are not improving. Please go to the ER for any worsening symptoms. This includes but is not limited to fever you can not control with tylenol  or ibuprofen , you are not able to stay hydrated, you have shortness of breath or chest pain.  Thank you for choosing [facility] for your healthcare needs. I hope you feel better soon!      ED Prescriptions   None    PDMP not reviewed this encounter.   Loreda Myla SAUNDERS, NP 09/14/23 [phone]
